# Patient Record
Sex: Female | Born: 1960 | Race: White | Hispanic: Yes | Marital: Married | Smoking: Never smoker
Health system: Southern US, Community
[De-identification: ages and names within clinical notes are randomized; demographics above are authoritative.]

## PROBLEM LIST (undated history)

## (undated) HISTORY — PX: KNEE SURGERY: SHX244

## (undated) HISTORY — PX: PATELLA FRACTURE SURGERY: SHX735

---

## 1997-10-07 ENCOUNTER — Other Ambulatory Visit: Admission: RE | Admit: 1997-10-07 | Discharge: 1997-10-07 | Payer: Self-pay | Admitting: Gynecology

## 2000-03-29 ENCOUNTER — Other Ambulatory Visit: Admission: RE | Admit: 2000-03-29 | Discharge: 2000-03-29 | Payer: Self-pay | Admitting: Gynecology

## 2002-04-18 ENCOUNTER — Other Ambulatory Visit: Admission: RE | Admit: 2002-04-18 | Discharge: 2002-04-18 | Payer: Self-pay | Admitting: Gynecology

## 2003-09-09 ENCOUNTER — Other Ambulatory Visit: Admission: RE | Admit: 2003-09-09 | Discharge: 2003-09-09 | Payer: Self-pay | Admitting: Gynecology

## 2004-09-09 ENCOUNTER — Other Ambulatory Visit: Admission: RE | Admit: 2004-09-09 | Discharge: 2004-09-09 | Payer: Self-pay | Admitting: Gynecology

## 2005-10-03 ENCOUNTER — Other Ambulatory Visit: Admission: RE | Admit: 2005-10-03 | Discharge: 2005-10-03 | Payer: Self-pay | Admitting: Gynecology

## 2006-09-24 ENCOUNTER — Emergency Department (HOSPITAL_COMMUNITY): Admission: EM | Admit: 2006-09-24 | Discharge: 2006-09-24 | Payer: Self-pay | Admitting: Emergency Medicine

## 2006-09-27 ENCOUNTER — Ambulatory Visit (HOSPITAL_COMMUNITY): Admission: RE | Admit: 2006-09-27 | Discharge: 2006-09-28 | Payer: Self-pay | Admitting: Orthopedic Surgery

## 2007-01-05 ENCOUNTER — Ambulatory Visit (HOSPITAL_COMMUNITY): Admission: RE | Admit: 2007-01-05 | Discharge: 2007-01-06 | Payer: Self-pay | Admitting: Orthopedic Surgery

## 2007-10-03 ENCOUNTER — Other Ambulatory Visit: Admission: RE | Admit: 2007-10-03 | Discharge: 2007-10-03 | Payer: Self-pay | Admitting: Gynecology

## 2008-12-01 ENCOUNTER — Ambulatory Visit: Payer: Self-pay | Admitting: Gynecology

## 2008-12-01 ENCOUNTER — Encounter: Payer: Self-pay | Admitting: Gynecology

## 2008-12-01 ENCOUNTER — Other Ambulatory Visit: Admission: RE | Admit: 2008-12-01 | Discharge: 2008-12-01 | Payer: Self-pay | Admitting: Gynecology

## 2010-07-27 NOTE — Op Note (Signed)
[name], [name]       ACCOUNT NO.:  [id number]   MEDICAL RECORD NO.:  [id number]          PATIENT TYPE:  OIB   LOCATION:  1538                         FACILITY:  [facility]   PHYSICIAN:  Ollen Gross, M.D.    DATE OF BIRTH:  1960-10-04   DATE OF PROCEDURE:  09/27/2006  DATE OF DISCHARGE:                               OPERATIVE REPORT   PREOPERATIVE DIAGNOSIS:  Left patella fracture.   POSTOPERATIVE DIAGNOSIS:  Left patella fracture.   PROCEDURE:  Open reduction internal fixation, left patella.   SURGEON:  Ollen Gross, M.D.   ASSISTANT:  Avel Peace PA-C   ANESTHESIA:  General.   ESTIMATED BLOOD LOSS:  Minimal.   DRAINS:  None.   TOURNIQUET TIME:  31 minutes at 300 mmHg.   COMPLICATIONS:  None.   CONDITION:  Stable to recovery.   BRIEF CLINICAL NOTE:  Marilyn Gross is a 50 year old female who sustained a  fracture of her left patella three days ago.  She presents now for  reduction internal fixation.   PROCEDURE IN DETAIL:  After successful administration of general  anesthetic, tourniquet was placed high on the left thigh, left lower  extremity prepped and draped usual sterile fashion.  Extremity was  wrapped in Esmarch.  Tourniquet inflated to 300 mmHg.  Midline incision  was made over the knee with the 10 blade through subcutaneous tissue to  the level of the extensor mechanism.  Superficial retinaculum is  incised.  There is transverse tear in the retinaculum as well as a  fracture at the junction of the distal one-third and proximal two thirds  of the patella.  There is a small amount of comminution.  I reduced it  with a towel clamp after thoroughly irrigating out the joint removing  hematoma.  A 0.062 K-wire was then driven from superior to inferior  starting at the superior pole of patella coursing across the fracture to  the inferior aspect.  The guidewires parallel to each other.  We then  passed a cerclage wire 16-gauge around the patella  circumferentially and  tightened this effectively closing down the fracture site and  anatomically reducing it.  A radiograph was taken AP and lateral.  The  more lateral pin was a little long, so I backed it up to the appropriate  length.  Then we cut the pins, bent them and rotated so they would not  protrude.  The x-rays taken AP and lateral showing excellent reduction  of the fracture.  Then thoroughly irrigated the wound and closed the  retinaculum with interrupted #1 Ethibond.  I flexed the knee against  gravity and 125 degrees was achieved without the fracture pulling apart.  Tourniquet was then released with a total time of 31 minutes.  Subcu  was closed with interrupted 2-0 Vicryl, subcuticular running 4-0  Monocryl.  The incisions cleaned and dried and Steri-Strips and bulky  sterile dressing applied.  She is placed into a hinged knee brace,  locked in full extension and then awakened and transported to recovery  in stable condition.      Ollen Gross, M.D.  Electronically Signed     FA/MEDQ  D:  09/27/2006  T:  09/28/2006  Job:  [id number]

## 2010-07-27 NOTE — Op Note (Signed)
Marilyn Gross, Marilyn Gross       ACCOUNT NO.:  [id number]   MEDICAL RECORD NO.:  [id number]          PATIENT TYPE:  OIB   LOCATION:  1609                         [facility]ACILITY:  [facility]   PHYSICIAN:  Ollen Gross, M.D.    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   OPERATION PERFORMED:  Left knee arthroscopic lysis of adhesions with  closed manipulation.   SURGEON:  Ollen Gross, M.D.   ASSISTANT:  None.   ANESTHESIA:  General.   ESTIMATED BLOOD LOSS:  Minimal.   DRAINS:  Hemovac times one.   COMPLICATIONS:  None occurred.   CLINICAL NOTE:  The patient a 50 year old female who had an on-the-job  injury with a left patellar fracture about 2.5 months ago.  She healed  uneventfully, but would not bend her knee with physical therapy.  She  had a range of about 10-40 degrees.  We had extensive therapy and  nonoperative management, but given her lack of progression I decided to  go ahead and do an arthroscopic lysis of adhesion with an associated  closed manipulation to get her motion back.   OPERATION IN DETAIL:  After the successful administration of a general  anesthetic a tourniquet was placed on the left thigh and the left lower  extremity prepped and draped in the usual sterile fashion.  Standard  superomedial and inferolateral incisions were made.  Inflow cannula was  passed superomedial and the camera was passed inferolateral.  Prior to  this I did an exam under anesthesia and her range was truly 10-40  degrees.  Once the cannulae were in the inflow was initiated and camera  passed inferolateral.  Arthroscopic visualization proceeded.  There was  tremendous scarring under the patella and in the suprapatellar pouch.  I  created a superolateral portal and placed a shaver through that.  I  shaved away a lot of the scar tissue noted here and in the quad tendon  to the femur.  Once it was shaved out then I used the ArthroCare device  for obtaining hemostasis and for the remainder of the rest of scar  tissue in the suprapatellar area.  I got down to the superior pole of  the patella and there was no binding between the cartilage of the  patella and cartilage of the trochlea.  There were no cartilage defects  either.   I then created an inferomedial portal and placed the ArthroCare through  there.  I subsequently debrided the scar tissue in the medial gutter as  well as in the infrapatellar area.  We then went back superolateral and  debrided the scar tissue in the lateral gutter.  I then decreased the  inflow pressure and obtained hemostasis.  We then removed the  arthroscopic equipment and I checked the range of motion, and she up to  about 70 at this point.  I then gently flexed the knee with audible  lysis of the remaining adhesions; and, I got her  flexed all the way to  about 135 degrees to the point where her calf was touching her posterior  thigh.  She then also was able to come out in full extension.  Her  patellar mobility was greatly improved.   I then placed the inflow cannula and camera back into the joint from the  superomedial and inferolateral portals respectively.  The ArthroCare was  then passed through the inferomedial portal to further obtain  hemostasis.  I then placed it also through the superolateral portal to  obtain hemostasis.  The inflow pressure was decreased to identify any  other further bleeding; and, this was not identified.   We then removed the arthroscopic equipment from the inferior portal and  superolateral portal, and closed those with interrupted 4-0 nylon.  Twenty milliliters of one-quarter  percent Marcaine with epi was injected  through the inflow cannula.  Then a Hemovac drain was threaded through  the cannula and the cannula was removed.  The incisions were closed, but  the drain was not sewn in.  The bulky sterile dressings were applied;  and, I again flexed her and she got down to 135 degrees relatively  easily.  Full extension was also achieved.   The patient was subsequently awakened and was transported to recovery in  stable condition.      Ollen Gross, M.D.  Electronically Signed     FA/MEDQ  D:  01/05/2007  T:  01/07/2007  Job:  [id number]

## 2010-12-22 LAB — CBC
HCT: 39.3
Hemoglobin: 13.6
MCHC: 34.5
MCV: 91
RDW: 11.8

## 2010-12-22 LAB — BASIC METABOLIC PANEL
Creatinine, Ser: 0.63
Glucose, Bld: 99
Potassium: 4.3

## 2010-12-27 LAB — HEMOGLOBIN AND HEMATOCRIT, BLOOD
HCT: 35.7 — ABNORMAL LOW
Hemoglobin: 12.7

## 2010-12-27 LAB — PROTIME-INR: INR: 1

## 2010-12-27 LAB — PREGNANCY, URINE: Preg Test, Ur: NEGATIVE

## 2010-12-27 LAB — APTT: aPTT: 32

## 2012-07-12 ENCOUNTER — Encounter: Payer: Self-pay | Admitting: Gynecology

## 2012-07-26 ENCOUNTER — Ambulatory Visit (INDEPENDENT_AMBULATORY_CARE_PROVIDER_SITE_OTHER): Payer: No Typology Code available for payment source | Admitting: Gynecology

## 2012-07-26 ENCOUNTER — Encounter: Payer: Self-pay | Admitting: Gynecology

## 2012-07-26 ENCOUNTER — Other Ambulatory Visit (HOSPITAL_COMMUNITY)
Admission: RE | Admit: 2012-07-26 | Discharge: 2012-07-26 | Disposition: A | Discharge status: Home / Self Care | Payer: No Typology Code available for payment source | Source: Ambulatory Visit | Attending: Gynecology | Admitting: Gynecology

## 2012-07-26 VITALS — BP 128/82 | Ht 61.75 in | Wt 152.0 lb

## 2012-07-26 DIAGNOSIS — N951 Menopausal and female climacteric states: Secondary | ICD-10-CM

## 2012-07-26 DIAGNOSIS — Z1151 Encounter for screening for human papillomavirus (HPV): Secondary | ICD-10-CM | POA: Insufficient documentation

## 2012-07-26 DIAGNOSIS — Z01419 Encounter for gynecological examination (general) (routine) without abnormal findings: Secondary | ICD-10-CM

## 2012-07-26 DIAGNOSIS — Z23 Encounter for immunization: Secondary | ICD-10-CM

## 2012-07-26 LAB — CBC WITH DIFFERENTIAL/PLATELET
Basophils Absolute: 0 10*3/uL (ref 0.0–0.1)
Basophils Relative: 0 % (ref 0–1)
Eosinophils Absolute: 0.2 10*3/uL (ref 0.0–0.7)
HCT: 37.9 % (ref 36.0–46.0)
Hemoglobin: 12.8 g/dL (ref 12.0–15.0)
MCH: 31.6 pg (ref 26.0–34.0)
MCHC: 33.8 g/dL (ref 30.0–36.0)
Monocytes Absolute: 0.5 10*3/uL (ref 0.1–1.0)
Monocytes Relative: 7 % (ref 3–12)
Neutro Abs: 4.5 10*3/uL (ref 1.7–7.7)
Neutrophils Relative %: 57 % (ref 43–77)
RDW: 13.3 % (ref 11.5–15.5)

## 2012-07-26 LAB — COMPREHENSIVE METABOLIC PANEL
ALT: 17 U/L (ref 0–35)
AST: 19 U/L (ref 0–37)
Albumin: 4.7 g/dL (ref 3.5–5.2)
CO2: 25 mEq/L (ref 19–32)
Calcium: 9.8 mg/dL (ref 8.4–10.5)
Chloride: 102 mEq/L (ref 96–112)
Potassium: 3.9 mEq/L (ref 3.5–5.3)
Total Protein: 7 g/dL (ref 6.0–8.3)

## 2012-07-26 LAB — FOLLICLE STIMULATING HORMONE: FSH: 54.8 m[IU]/mL

## 2012-07-26 LAB — TSH: TSH: 1.67 u[IU]/mL (ref 0.350–4.500)

## 2012-07-26 LAB — LIPID PANEL
LDL Cholesterol: 94 mg/dL (ref 0–99)
VLDL: 17 mg/dL (ref 0–40)

## 2012-07-26 NOTE — Progress Notes (Signed)
No menses due to iud 

## 2012-07-26 NOTE — Progress Notes (Signed)
Marilyn Gross 06-Feb-1961 [id number]   History:    52 y.o.  for annual gyn exam with no complaints today. Review of patient's records indicated she has not been seen the office since 2010. She had a Mirena IUD placed in July of 2009. Patient would know prior history of abnormal Pap smears. Patient had a normal mammogram this year and does her monthly breast exam. She is not receive the Tdap vaccine ordered a colonoscopy as of yet.  Past medical history,surgical history, family history and social history were all reviewed and documented in the EPIC chart.  Gynecologic History No LMP recorded. Patient is not currently having periods (Reason: IUD). Contraception: IUD Last Pap: 2010. Results were: normal Last mammogram: 2014. Results were: normal  Obstetric History OB History   Grav Para Term Preterm Abortions TAB SAB Ect Mult Living   2 2        2      # Outc Date GA Lbr Len/2nd Wgt Sex Del Anes PTL Lv   1 PAR            2 PAR                ROS: A ROS was performed and pertinent positives and negatives are included in the history.  GENERAL: No fevers or chills. HEENT: No change in vision, no earache, sore throat or sinus congestion. NECK: No pain or stiffness. CARDIOVASCULAR: No chest pain or pressure. No palpitations. PULMONARY: No shortness of breath, cough or wheeze. GASTROINTESTINAL: No abdominal pain, nausea, vomiting or diarrhea, melena or bright red blood per rectum. GENITOURINARY: No urinary frequency, urgency, hesitancy or dysuria. MUSCULOSKELETAL: No joint or muscle pain, no back pain, no recent trauma. DERMATOLOGIC: No rash, no itching, no lesions. ENDOCRINE: No polyuria, polydipsia, no heat or cold intolerance. No recent change in weight. HEMATOLOGICAL: No anemia or easy bruising or bleeding. NEUROLOGIC: No headache, seizures, numbness, tingling or weakness. PSYCHIATRIC: No depression, no loss of interest in normal activity or change in sleep pattern.      Exam: chaperone present  BP 128/82  Ht 5' 1.75" (1.568 m)  Wt 152 lb (68.947 kg)  BMI 28.04 kg/m2  Body mass index is 28.04 kg/(m^2).  General appearance : Well developed well nourished female. No acute distress HEENT: Neck supple, trachea midline, no carotid bruits, no thyroidmegaly Lungs: Clear to auscultation, no rhonchi or wheezes, or rib retractions  Heart: Regular rate and rhythm, no murmurs or gallops Breast:Examined in sitting and supine position were symmetrical in appearance, no palpable masses or tenderness,  no skin retraction, no nipple inversion, no nipple discharge, no skin discoloration, no axillary or supraclavicular lymphadenopathy Abdomen: no palpable masses or tenderness, no rebound or guarding Extremities: no edema or skin discoloration or tenderness  Pelvic:  Bartholin, Urethra, Skene Glands: Within normal limits             Vagina: No gross lesions or discharge  Cervix: No gross lesions or discharge, IUD string seen  Uterus  anteverted, normal size, shape and consistency, non-tender and mobile  Adnexa  Without masses or tenderness  Anus and perineum  normal   Rectovaginal  normal sphincter tone without palpated masses or tenderness             Hemoccult cards provided     Assessment/Plan:  52 y.o. female for annual exam who was having some mild vasomotor symptoms and is now 52 years of age. Her IUD is scheduled to be removed in July of  this year. We are going to check her [facility] level today along with the rest of her labs. If her [facility] is in the menopausal range she will return back in July we will remove her IUD then. If the [facility] is in the normal range she will return back in July and we will change her IUD to a new one. The following labs were also ordered CBC, fasting lipid profile, comprehensive metabolic panel, as well as Pap smear. Tdap vaccine was done today. Patient was given the name of one of my GI colleagues for her to schedule colonoscopy. She was  reminded to submit to the office Hemoccult cards for testing. We discussed importance of monthly self breast examination as well as calcium and vitamin D on a regular basis as well as exercise. All the above was explained to the patient in Spanish.    Reynaldo Minium H MD, 1:25 PM 07/26/2012

## 2012-07-26 NOTE — Patient Instructions (Addendum)
Control del colesterol  Los niveles de colesterol en el organismo estn determinados significativamente por su dieta. Los niveles de colesterol tambin se relacionan con la enfermedad cardaca. El material que sigue ayuda a explicar esta relacin y a analizar qu puede hacer para mantener su corazn sano. No todo el colesterol es malo. Las lipoprotenas de baja densidad (LDL) forman el colesterol "malo". El colesterol malo puede ocasionar depsitos de grasa que se acumulan en el interior de las arterias. Las lipoprotenas de alta densidad (HDL) es el colesterol "bueno". Ayuda a remover el colesterol LDL "malo" de la sangre. El colesterol es un factor de riesgo muy importante para la enfermedad cardaca. Otros factores de riesgo son la hipertensin arterial, el hbito de fumar, el estrs, la herencia y el peso.   El msculo cardaco obtiene el suministro de sangre a travs de las arterias coronarias. Si su colesterol LDL ("malo") est elevado y el HDL ("bueno") es bajo, tiene un factor de riesgo para que se formen depsitos de grasa en las arterias coronarias (los vasos sanguneos que suministran sangre al corazn). Esto hace que haya menos lugar para que la sangre circule. Sin la suficiente sangre y oxgeno, el msculo cardaco no puede funcionar correctamente, y usted podr sentir dolores en el pecho (angina pectoris). Cuando una arteria coronaria se cierra completamente, una parte del msculo cardaco puede morir (infarto de miocardio).  CONTROL DEL COLESTEROL Cuando el profesional que lo asiste enva la sangre al laboratorio para conocer el nivel de colesterol, puede realizarle tambin un perfil completo de los lpidos. Con esta prueba, se puede determinar la cantidad total de colesterol, as como los niveles de LDL y HDL. Los triglicridos son un tipo de grasa que circula en la sangre y que tambin puede utilizarse para determinar el riesgo de enfermedad  cardaca. En la siguiente tabla se establecen los nmeros ideales: Prueba: Colesterol total  Menos de 200 mg/dl.  Prueba: LDL "colesterol malo"  Menos de 100 mg/dl.   Menos de 70 mg/dl si tiene riesgo muy elevado de sufrir un ataque cardaco o muerte cardaca sbita.  Prueba: HDL "colesterol bueno"  Mujeres: Ms de 50 mg/dl.   Hombres: Ms de 40 mg/dl.  Prueba: Trigliceridos  Menos de 150 mg/dl.    CONTROL DEL COLESTEROL CON DIETA Aunque factores como el ejercicio y el estilo de vida son importantes, la "primera lnea de ataque" es la dieta. Esto se debe a que se sabe que ciertos alimentos hacen subir el colesterol y otros lo bajan. El objetivo debe ser equilibrar los alimentos, de modo que tengan un efecto sobre el colesterol y, an ms importante, reemplazar las grasas saturadas y trans con otros tipos de grasas, como las monoinsaturadas y las poliinsaturadas y cidos grasos omega-3 . En promedio, una persona no debe consumir ms de 15 a 17 g de grasas saturadas por da. Las grasas saturadas y trans se consideran grasas "malas", ya que elevan el colesterol LDL. Las grasas saturadas se encuentran principalmente en productos animales como carne, manteca y crema. Pero esto no significa que usted debe sacrificar todas sus comidas favoritas. Actualmente, como lo muestra el cuadro que figura al final de este documento, hay sustitutos de buen sabor, bajos en grasas y en colesterol, para la mayora de los alimentos que a usted le gusta comer. Elija aquellos alimentos alternativos que sean bajos en grasas o sin grasas. Elija cortes de carne del cuarto trasero o lomo ya que estos cortes son los que tienen menor cantidad de grasa   y colesterol. El pollo (sin piel), el pescado, la carne de ternera, y la pechuga de pavo molida son excelentes opciones. Elimine las carnes grasosas como los hotdogs o el salami. Los mariscos tienen poco o nada de grasas saturadas. Cuando consuma carne magra, carne de aves de  corral, o pescado, hgalo en porciones de 85 gramos (3 onzas). Las grasas trans tambin se llaman "aceites parcialmente hidrogenados". Son aceites manipulados cientficamente de modo que son slidos a temperatura ambiente, tienen una larga vida y mejoran el sabor y la textura de los alimentos a los que se agregan. Las grasas trans se encuentran en la margarina, masitas, crackers y alimentos horneados.  Para hornear y cocinar, el aceite es un excelente sustituto para la mantequilla. Los aceites monoinsaturados tienen un beneficio particular, ya que se cree que disminuyen el colesterol LDL (colesterol malo) y elevan el HDL. Deber evitar los aceites tropicales saturados como el de coco y el de palma.  Recuerde, adems, que puede comer sin restricciones los grupos de alimentos que son naturalmente libres de grasas saturadas y grasas trans, entre los que se incluyen el pescado, las frutas (excepto el aguacate), verduras, frijoles, cereales (cebada, arroz, cuzcuz, trigo) y las pastas (sin salsas con crema)   IDENTIFIQUE LOS ALIMENTOS QUE DISMINUYEN EL COLESTEROL  Pueden disminuir el colesterol las fibras solubles que estn en las frutas, como las manzanas, en los vegetales como el brcoli, las patatas y las zanahorias; en las legumbres como frijoles, guisantes y lentejas; y en los cereales como la cebada. Los alimentos fortificados con fitosteroles tambin pueden disminuir el colesterol. Debe consumir al menos 2 g de estos alimentos a diario para obtener el efecto de disminucin de colesterol.  En el supermercado, lea las etiquetas de los envases para identificar los alimentos bajos en grasas saturadas, libres de grasas trans y bajos en grasas, . Elija quesos que tengan solo de 2 a 3 g de grasa saturada por onza (28,35 g). Use una margarina que no dae el corazn, libre de grasas trans o aceite parcialmente hidrogenado. Al comprar alimentos horneados (galletitas dulces y galletas) evite el aceite parcialmente  hidrogenado. Los panes y bollos debern ser de granos enteros (harina de maz o de avena entera, en lugar de "harina" o "harina enriquecida"). Compre sopas en lata que no sean cremosas, con bajo contenido de sal y sin grasas adicionadas.   TCNICAS DE PREPARACIN DE LOS ALIMENTOS  Nunca fra los alimentos en aceite abundante. Si debe frer, hgalo en poco aceite y removiendo constantemente, porque as se utilizan muy pocas grasas, o utilice un spray antiadherente. Cuando le sea posible, hierva, hornee o ase las carnes y cocine los vegetales al vapor. En vez de aderezar los vegetales con mantequilla o margarina, utilice limn y hierbas, pur de manzanas y canela (para las calabazas y batatas), yogurt y salsa descremados y aderezos para ensaladas bajos en contenido graso.   BAJO EN GRASAS SATURADAS / SUSTITUTOS BAJOS EN GRASA  Carnes / Grasas saturadas (g)  Evite: Bife, corte graso (3 oz/85 g) / 11 g   Elija: Bife, corte magro (3 oz/85 g) / 4 g   Evite: Hamburguesa (3 oz/85 g) / 7 g   Elija:  Hamburguesa magra (3 oz/85 g) / 5 g   Evite: Jamn (3 oz/85 g) / 6 g   Elija:  Jamn magro (3 oz/85 g) / 2.4 g   Evite: Pollo, con piel (3 oz/85 g), Carne oscura / 4 g   Elija:  Pollo, sin piel (  3 oz/85 g), Carne oscura / 2 g   Evite: Pollo, con piel (3 oz/85 g), Carne magra / 2.5 g   Elija: Pollo, sin piel (3 oz/85 g), Carne magra / 1 g  Lcteos / Grasas saturadas (g)  Evite: Leche entera (1 taza) / 5 g   Elija: Leche con bajo contenido de grasa, 2% (1 taza) / 3 g   Elija: Leche con bajo contenido de grasa, 1% (1 taza) / 1.5 g   Elija: Leche descremada (1 taza) / 0.3 g   Evite: Queso duro (1 oz/28 g) / 6 g   Elija: Queso descremado (1 oz/28 g) / 2-3 g   Evite: Queso cottage, 4% grasa (1 taza)/ 6.5 g   Elija: Queso cottage con bajo contenido de grasa, 1% grasa (1 taza)/ 1.5 g   Evite: Helado (1 taza) / 9 g   Elija: Sorbete (1 taza) / 2.5 g   Elija: Yogurt helado sin contenido de  grasa (1 taza) / 0.3 g   Elija: Barras de fruta congeladas / vestigios   Evite: Crema batida (1 cucharada) / 3.5 g   Elija: Batidos glac sin lcteos (1 cucharada) / 1 g  Condimentos / Grasas saturadas (g)  Evite: Mayonesa (1 cucharada) / 2 g   Elija: Mayonesa con bajo contenido de grasa (1 cucharada) / 1 g   Evite: Manteca (1 cucharada) / 7 g   Elija: Margarina extra light (1 cucharada) / 1 g   Evite: Aceite de coco (1 cucharada) / 11.8 g   Elija: Aceite de oliva (1 cucharada) / 1.8 g   Elija: Aceite de maz (1 cucharada) / 1.7 g   Elija: Aceite de crtamo (1 cucharada) / 1.2 g   Elija: Aceite de girasol (1 cucharada) / 1.4 g   Elija: Aceite de soja (1 cucharada) / 2.4 g   Elija: Aceite de canola (1 cucharada) / 1 g  Document Released: 02/28/2005 Document Revised: 11/10/2010 [employer] Patient Information 2012 [employer]. Ejercicios para perder peso (Exercise to Lose Weight) La actividad fsica y una dieta saludable ayudan a perder peso. El mdico podr sugerirle ejercicios especficos. IDEAS Y CONSEJOS PARA HACER EJERCICIOS  Elija opciones econmicas que disfrute hacer , como caminar, andar en bicicleta o los vdeos para ejercitarse.   Utilice las escaleras en lugar del ascensor.   Camine durante la hora del almuerzo.   Estacione el auto lejos del lugar de trabajo o estudio.   Concurra a un gimnasio o tome clases de gimnasia.   Comience con 5  10 minutos de actividad fsica por da. Ejercite hasta 30 minutos, 4 a 6 das por semana.   Utilice zapatos que tengan un buen soporte y ropas cmodas.   Elongue antes y despus de ejercitar.   Ejercite hasta que aumente la respiracin y el corazn palpite rpido.   Beba agua extra cuando ejercite.   No haga ejercicio hasta lastimarse, sentirse mareado o que le falte mucho el aire.  La actividad fsica puede quemar alrededor de 150 caloras.  Correr 20 cuadras en 15 minutos.   Jugar vley durante 45 a 60  minutos.   Limpiar y encerar el auto durante 45 a 60 minutos.   Jugar ftbol americano de toque.   Caminar 25 cuadras en 35 minutos.   Empujar un cochecito 20 cuadras en 30 minutos.   Jugar baloncesto durante 30 minutos.   Rastrillar hojas secas durante 30 minutos.   Andar en bicicleta 80 cuadras en 30 minutos.     Caminar 30 cuadras en 30 minutos.   Bailar durante 30 minutos.   Quitar la nieve con una pala durante 15 minutos.   Nadar vigorosamente durante 20 minutos.   Subir escaleras durante 15 minutos.   Andar en bicicleta 60 cuadras durante 15 minutos.   Arreglar el jardn entre 30 y 45 minutos.   Saltar a la soga durante 15 minutos.   Limpiar vidrios o pisos durante 45 a 60 minutos.  Document Released: 06/04/2010 Document Revised: 11/10/2010 [facility] Patient Information 2012 [place], [place].Vacuna difteria/ttanos (Td) o [place] difteria, ttanos, tos convulsa (Tdap), Lo que debe saber (Tetanus, Diphtheria [Td] or Tetanus, Diphtheria, Pertussis [Tdap] Vaccine, What You Need to Know) PORQU VACUNARSE? El ttanos , la difteria y la tos ferina pueden ser enfermedades graves.  El TTANOS  (trismo) provoca la contraccin dolorosa y rigidez de los msculos, por lo general, en todo el cuerpo.   Puede causar la contraccin de los msculos de la cabeza y el cuello de modo que el enfermo no puede abrir la boca ni tragar., y en algunos casos, tampoco puede respirar.. El ttanos causa la muerte de 1 de cada 5 personas que se infectan. LA DIFTERIA produce la formacin de una membrana gruesa que cubre el fondo de la garganta.  Puede causar problemas respiratorios, parlisis, insuficiencia cardaca, e incluso la muerte. El PERTUSIS (tos [place]) causa ataques de tos intensa que pueden dificultar la respiracin, provocar vmitos e interrumpir el sueo.   Puede causar prdida de peso, incontinencia, fractura de [place], y desmayos por la intensa tos. Hasta de 2 de cada 100  adolescentes y 5 de cada 100 adultos que enferman de tos [place] deben ser hospitalizados o tienen complicaciones como la neumona y la [place]. Estas 3 enfermedades son provocadas por bacterias. La difteria y la tos Benetta Spar se [place] de persona a Social worker. El ttanos ingresa al organismo a travs de cortes, rasguos o heridas. En los [place] ocurran alrededor de 200 000 casos por ao de difteria y tos [place], antes de que existieran las [place], y tambin ocurran cientos de casos de ttanos. Desde la aparicin de las vacunas, el ttanos y la difteria han disminuido en alrededor del 99% y los casos de tos ferina disminuyeron aproximadamente el 92%.  Los nios menores de 6 aos deben recibir la vacuna DTaP para estar protegidos contra estas tres enfermedades. Pero los [employer], los adolescentes y los adultos tambin necesitan proteccin. VACUNAS PARA ADOLESCENTES Y ADULTOS Vacunas Tdap y Td  Hay dos vacunas disponibles para proteger de estas enfermedades a nios a Glass blower/designer de los 7aos:   La vacuna Td fue utilizada durante muchos aos. [employer] contra el ttanos y la difteria.  La vacuna Tdap fue autorizada en 2005. Es la primera vacuna para adolescentes y adultos que protege contra la tos ferina y el ttanos y la difteria. Una dosis de refuerzo de la Td se recomienda cada 10 aos. La Tdap se aplica slo una vez.  QU VACUNA DEBO APLICARME Y CUANDO? Las edades de 7 a 18 aos  [employer] 11 y los 12 aos se recomienda una dosis de Tdap. Esta dosis puede aplicarse desde los 7 aos en los nios que no han recibido una o ms dosis de DTaP anteriormente.  Los nios y adolescentes que no recibieron todas las dosis programadas de DTaP o DTP a los 7 aos deben completar la serie usando una combinacin de Td y Tdap. Adultos de 19 aos o ms  [employer] deben recibir  una dosis de refuerzo de Td cada 10 aos. Los adultos de menos de 65 aos que nunca hayan recibido la Tdap deben reemplazarla  por la siguiente dosis de refuerzo. Los adultos a partir de los 65 aos puedenrecibir una dosis de Tdap.  Los adultos (incluyendo las mujeres que podran quedar embarazadas y los adultos mayores de 65 aos) que tienen contacto cercano con un beb menor de 12 meses deben aplicarse una dosis de Tdap para proteger al beb de la tos [place].  Los trabajadores de la salud que tengan contacto directo con pacientes en hospitales o clnicas deben recibir una dosis de Tdap. Proteccin despus de [place] herida  Es posible que una persona que tenga un corte o quemadura grave necesite una dosis de Td o Tdap para prevenir la infeccin por ttanos. Puede usarse la Tdap en personas que nunca recibieron una dosis. Pero debe usarse la Td, si la Tdap no se encuentra disponible, o para:  Cualquier persona que haya recibido una dosis de Tdap.  Los nios [employer] 7 y los 9 aos que han [employer] series de DTap anteriormente.  Adultos de 65 aos o ms. Mujeres embarazadas.   Las mujeres embarazadas que nunca recibieron una dosis de Ddap deben recibirla despus de la 20a semana de gestacin y preferiblemente durante Contractor. trimestre. Si no se aplican la Tdap durante el embarazo, deben recibirla lo antes posible despus del parto. Las mujeres embarazadas que han recibido la Tdap y tienen que aplicarse la vacuna contra el ttanos o la difteria durante el [place], deben recibir la Td. Las vacunas Tdap y Td pueden ser administradas al mismo tiempo que otras vacunas. ALGUNAS PERSONAS NO DEBEN RECIBIR LA VACUNA O DEBEN [employer]  Las personas que hayan tenido una reaccin alrgica que haya puesto en peligro su vida despus de una dosis de vacuna contra el ttanos, la difteria o la tos ferina no deben recibir Td ni Tdap..  Las personas que tengan alergias graves a algn componente de una vacuna no deben recibir esa vacuna. Informe a su mdico si la persona que recibe la vacuna sufre alergias graves.  Cualquier persona  que [employer] en coma o que haya tenido convulsiones dentro de los 7 [address]  [address] posteriores despus de una dosis de DTP o DTaP no debe recibir la Tdap, salvo que se encuentre una causa que no fuera la vacuna. Estas personas pueden recibir Td.  Consulte a su mdico si la persona que recibe [place] de las vacunas:  Tiene epilepsia o algn otro problema del sistema nervioso.  Tuvo inflamacin o dolor intenso despus de una dosis de DTP, DTaP, DT, Td, o Tdap.  Ha tenido el sndrome de Scientific laboratory technician (GBS por sus siglas en ingls). Las personas que sufran una enfermedad moderada o grave el da en que se programa la vacuna, deben esperar a recuperarse para recibir las vacunas Tdap o Td. Por lo general, una persona con una enfermedad leve o fiebre baja puede recibir la vacuna. CULES SON LOS RIESGOS DE LAS VACUNAS TDAP Y TD? Con Cathleen Corti, al igual que con cualquier [employer], siempre existe un pequeo riesgo de una reaccin alrgica que ponga en peligro la vida o cause otro problema grave. Todo procedimiento mdico, inclusive la vacunacin pueden causar breves episodios de lipotimia o sntomas relacionados (como movimientos espasmdicos). Para evitar los [employer] y las lesiones causadas por las cadas, permanezca sentado o recustese durante los 15 minutos posteriores a la vacunacin. Informe a su mdico si el paciente  se siente dbil o mareado, tiene cambios en la visin o siente zumbidos en los odos.  Es mucho ms probable que tener ttanos, difteria, o tos ferina cause problemas ms graves que los provocados por recibir cualquiera de las vacunas Td o Tdap. A continuacin se enumeran los problemas informados despus de las vacunas Td y Tdap. Problemas Leves (perceptibles, pero que no interfirieron con las actividades): Tdap  Dolor (alrededor de 3 de cada 4 adolescentes y 2 de cada 3 adultos).  Enrojecimiento o inflamacin en el sitio de la inyeccin (alrededor de 1 de cada 5).  Fiebre leve de al  menos 100.4 F (38 C) (hasta alrededor de 1 cada 25 adolescentes y 1 de cada 100 adultos).  Dolor de cabeza (alrededor de 4 de cada 10 adolescentes y 3 de cada 10 adultos).  Cansancio (alrededor de 1 de cada 3 adolescentes y 1 de cada 4 adultos).  Nuseas, vmitos, diarrea, o dolor de estmago (hasta 1 de cada 4 adolescentes y 1 de cada 10 adultos).  Escalofros, dolores corporales, dolor articular, erupciones, o inflamacin de las glndulas (poco frecuente). Td  Dolor (hasta alrededor de 8 de cada 10).  Enrojecimiento o inflamacin de la inyeccin (alrededor de 1 de cada 3).  Fiebre leve (hasta alrededor de 1 de cada 5).  Dolor de cabeza o cansancio (poco frecuente). Problemas Moderados (interfieren con las [place], [place] no requieren atencin mdica): Tdap  Dolor en el sitio de la inyeccin (alrededor de 1 de cada 20 adolescentes y 1 de cada 100 adultos).  Enrojecimiento o inflamacin de la inyeccin (alrededor de 1 de cada 16 adolescentes y 1 de cada 25 adultos).  Fiebre de ms de 102 F (38.9 C) (alrededor de 1 de cada 100 adolescentes y 1 de cada 250 adultos).  Dolor de cabeza (1 de cada 300).  Nuseas, vmitos, diarrea, o dolor de estmago (hasta 3 de cada 100 adolescentes y 1 de cada 100 adultos). Td  Fiebre de ms de 102 F (38.9 C) (poco comn). Tdap o Td  Inflamacin de gran extensin en el brazo en el que se aplic la vacuna (hasta 3 de cada 100). Problemas [place] (no puede realizar [employer]; requiere Psychologist, prison and probation services) Tdap o Td  Inflamacin, dolor intenso, sangrado y enrojecimiento en el brazo, en el sitio de la inyeccin (poco frecuente). Puede producirse una reaccin alrgica grave despus de cualquier vacuna. Se estima que estas reacciones ocurren en menos de una de cada un milln de dosis. QU PASA SI HAY UNA REACCIN GRAVE? Qu signos debo buscar? Cualquier estado poco habitual, como una reaccin alrgica grave o fiebre alta. Si le  produce Runner, broadcasting/film/video grave, se manifestar dentro de algunos minutos a una hora despus de recibir la vacuna. Entre los signos de Automotive engineer grave se encuentran la dificultad para respirar, debilidad, ronquera o sibilancias, latidos cardacos acelerados, urticaria, mareos, palidez, o inflamacin de la garganta. Qu debo hacer?  Comunquese con su mdico o lleve inmediatamente a la persona al mdico.  Dgale a su mdico qu ocurri, la fecha y hora en que sucedi y cundo le aplicaron la vacuna.  Pida a su mdico que informe sobre la reaccin llenando un formulario del Sistema de Informacin de Reacciones Adversos a las Administrator, arts (VAERS, por sus siglas en ingls). O, puede presentar este informe a travs del sitio web de VAERS [email or web address] o puede llamar al [phone]. VAERS no brinda asistencia mdica. PROGRAMA NACIONAL DE COMPENSACIN DE DAOS POR VACUNAS El [place] de Compensacin  de Daos por Vacunas (VICP) fue creado en 1986.  Aquellas personas que consideren que han sufrido un dao como consecuencia de una vacuna y quieren saber ms acerca del programa y como presentar Roslynn Amble, [place] llamar al [phone] o visitar su sitio web en [email or web address]  CMO Roxan Diesel MS INFORMACIN?  El profesional podr darle el prospecto de la vacuna o sugerirle otras fuentes de informacin.  Comunquese con el servicio de salud de su localidad o [address].  Comunquese con los Centros para el control y la prevencin de Child psychotherapist for Disease Control and Prevention , CDC).  Llame al [phone] (1-800-CDC-INFO).  Visite los sitios web de [employer] en [email or web address].uy CDC Td and Tdap Interim VIS-Spanish (04/06/10) Document Released: 06/16/2008 Document Revised: 05/23/2011 [facility] Patient Information 2013 [place], [place].

## 2012-07-27 LAB — URINALYSIS W MICROSCOPIC + REFLEX CULTURE
Bacteria, UA: NONE SEEN
Bilirubin Urine: NEGATIVE
Crystals: NONE SEEN
Hgb urine dipstick: NEGATIVE
Ketones, ur: NEGATIVE mg/dL
Protein, ur: NEGATIVE mg/dL
Specific Gravity, Urine: 1.01 (ref 1.005–1.030)
Urobilinogen, UA: 0.2 mg/dL (ref 0.0–1.0)

## 2012-07-29 LAB — URINE CULTURE

## 2012-07-30 ENCOUNTER — Encounter: Payer: Self-pay | Admitting: Gynecology

## 2012-07-31 ENCOUNTER — Other Ambulatory Visit: Payer: Self-pay | Admitting: Anesthesiology

## 2012-07-31 ENCOUNTER — Telehealth: Payer: Self-pay | Admitting: Gynecology

## 2012-07-31 DIAGNOSIS — N390 Urinary tract infection, site not specified: Secondary | ICD-10-CM

## 2012-07-31 MED ORDER — NITROFURANTOIN MONOHYD MACRO 100 MG PO CAPS: 100.0000 mg | ORAL_CAPSULE | Freq: Two times a day (BID) | ORAL | Status: DC

## 2012-07-31 NOTE — Telephone Encounter (Signed)
07/31/12-I checked pt Mirena benefits with her [employer] and was told it is covered at 100%. Debarah Crape will call pt as she doesn't speak English & give her this information/[place]

## 2012-08-03 ENCOUNTER — Encounter: Payer: Self-pay | Admitting: Gynecology

## 2012-08-09 ENCOUNTER — Other Ambulatory Visit: Payer: Self-pay | Admitting: Anesthesiology

## 2012-08-09 DIAGNOSIS — Z1211 Encounter for screening for malignant neoplasm of colon: Secondary | ICD-10-CM

## 2013-12-26 ENCOUNTER — Ambulatory Visit (INDEPENDENT_AMBULATORY_CARE_PROVIDER_SITE_OTHER): Payer: No Typology Code available for payment source | Admitting: Gynecology

## 2013-12-26 ENCOUNTER — Encounter: Payer: Self-pay | Admitting: Gynecology

## 2013-12-26 VITALS — BP 150/87 | Ht 63.0 in | Wt 167.0 lb

## 2013-12-26 DIAGNOSIS — Z01419 Encounter for gynecological examination (general) (routine) without abnormal findings: Secondary | ICD-10-CM

## 2013-12-26 DIAGNOSIS — N951 Menopausal and female climacteric states: Secondary | ICD-10-CM | POA: Insufficient documentation

## 2013-12-26 DIAGNOSIS — Z23 Encounter for immunization: Secondary | ICD-10-CM

## 2013-12-26 DIAGNOSIS — R635 Abnormal weight gain: Secondary | ICD-10-CM

## 2013-12-26 DIAGNOSIS — Z1159 Encounter for screening for other viral diseases: Secondary | ICD-10-CM

## 2013-12-26 LAB — CBC WITH DIFFERENTIAL/PLATELET
Basophils Absolute: 0 10*3/uL (ref 0.0–0.1)
Basophils Relative: 0 % (ref 0–1)
EOS ABS: 0.2 10*3/uL (ref 0.0–0.7)
Eosinophils Relative: 2 % (ref 0–5)
HCT: 38.2 % (ref 36.0–46.0)
HEMOGLOBIN: 12.9 g/dL (ref 12.0–15.0)
LYMPHS ABS: 2.5 10*3/uL (ref 0.7–4.0)
LYMPHS PCT: 32 % (ref 12–46)
MCH: 30.9 pg (ref 26.0–34.0)
MCHC: 33.8 g/dL (ref 30.0–36.0)
MCV: 91.6 fL (ref 78.0–100.0)
MONOS PCT: 8 % (ref 3–12)
Monocytes Absolute: 0.6 10*3/uL (ref 0.1–1.0)
NEUTROS PCT: 58 % (ref 43–77)
Neutro Abs: 4.5 10*3/uL (ref 1.7–7.7)
Platelets: 300 10*3/uL (ref 150–400)
RBC: 4.17 MIL/uL (ref 3.87–5.11)
RDW: 13.4 % (ref 11.5–15.5)
WBC: 7.7 10*3/uL (ref 4.0–10.5)

## 2013-12-26 LAB — LIPID PANEL
CHOLESTEROL: 195 mg/dL (ref 0–200)
HDL: 68 mg/dL (ref >39)
LDL Cholesterol: 109 mg/dL — ABNORMAL HIGH (ref 0–99)
TRIGLYCERIDES: 88 mg/dL (ref <150)
Total CHOL/HDL Ratio: 2.9 Ratio
VLDL: 18 mg/dL (ref 0–40)

## 2013-12-26 LAB — COMPREHENSIVE METABOLIC PANEL
ALBUMIN: 4.4 g/dL (ref 3.5–5.2)
ALT: 14 U/L (ref 0–35)
AST: 19 U/L (ref 0–37)
Alkaline Phosphatase: 89 U/L (ref 39–117)
BILIRUBIN TOTAL: 1.2 mg/dL (ref 0.2–1.2)
BUN: 10 mg/dL (ref 6–23)
CALCIUM: 9.7 mg/dL (ref 8.4–10.5)
CHLORIDE: 104 meq/L (ref 96–112)
CO2: 26 meq/L (ref 19–32)
Creat: 0.59 mg/dL (ref 0.50–1.10)
GLUCOSE: 91 mg/dL (ref 70–99)
Potassium: 4.6 mEq/L (ref 3.5–5.3)
SODIUM: 139 meq/L (ref 135–145)
TOTAL PROTEIN: 7 g/dL (ref 6.0–8.3)

## 2013-12-26 LAB — TSH: TSH: 2.168 u[IU]/mL (ref 0.350–4.500)

## 2013-12-26 NOTE — Progress Notes (Signed)
Marilyn MaeMaria D Gross [birth year] [id number]   History:    53 y.o.  for annual gyn exam with no complaints today. Patient had a Mirena IUD placed in 2009 patient was wondered if needed to be removed now. She is having minimal if any vasomotor symptoms. Patient with no past history of abnormal Pap smears. She's having normal menstrual cycles. Patient has not had her screening colonoscopy. Patient requesting flu vaccine today. Patient did receive the Tdap vaccine last year.  Past medical history,surgical history, family history and social history were all reviewed and documented in the EPIC chart.  Gynecologic History No LMP recorded. Patient is not currently having periods (Reason: IUD). Contraception: IUD Last Pap: 2014. Results were: normal Last mammogram: 2000 410. Results were: normal  Obstetric History OB History  Gravida Para Term Preterm AB SAB TAB Ectopic Multiple Living  2 2        2     # Outcome Date GA Lbr Len/2nd Weight Sex Delivery Anes PTL Lv  2 PAR           1 PAR                ROS: A ROS was performed and pertinent positives and negatives are included in the history.  GENERAL: No fevers or chills. HEENT: No change in vision, no earache, sore throat or sinus congestion. NECK: No pain or stiffness. CARDIOVASCULAR: No chest pain or pressure. No palpitations. PULMONARY: No shortness of breath, cough or wheeze. GASTROINTESTINAL: No abdominal pain, nausea, vomiting or diarrhea, melena or bright red blood per rectum. GENITOURINARY: No urinary frequency, urgency, hesitancy or dysuria. MUSCULOSKELETAL: No joint or muscle pain, no back pain, no recent trauma. DERMATOLOGIC: No rash, no itching, no lesions. ENDOCRINE: No polyuria, polydipsia, no heat or cold intolerance. No recent change in weight. HEMATOLOGICAL: No anemia or easy bruising or bleeding. NEUROLOGIC: No headache, seizures, numbness, tingling or weakness. PSYCHIATRIC: No depression, no loss of interest in normal  activity or change in sleep pattern.     Exam: chaperone present  BP 150/87  Ht 5\' 3"  (1.6 m)  Wt 167 lb (75.751 kg)  BMI 29.59 kg/m2  Body mass index is 29.59 kg/(m^2).  General appearance : Well developed well nourished female. No acute distress HEENT: Neck supple, trachea midline, no carotid bruits, no thyroidmegaly Lungs: Clear to auscultation, no rhonchi or wheezes, or rib retractions  Heart: Regular rate and rhythm, no murmurs or gallops Breast:Examined in sitting and supine position were symmetrical in appearance, no palpable masses or tenderness,  no skin retraction, no nipple inversion, no nipple discharge, no skin discoloration, no axillary or supraclavicular lymphadenopathy Abdomen: no palpable masses or tenderness, no rebound or guarding Extremities: no edema or skin discoloration or tenderness  Pelvic:  Bartholin, Urethra, Skene Glands: Within normal limits             Vagina: No gross lesions or discharge  Cervix: No gross lesions or discharge, IUD string seen  Uterus  anteverted, normal size, shape and consistency, non-tender and mobile  Adnexa  Without masses or tenderness  Anus and perineum  normal   Rectovaginal  normal sphincter tone without palpated masses or tenderness             Hemoccult cards will be provided     Assessment/Plan:  53 y.o. female for annual exam who was counseled on the importance of screening colonoscopy name of gastroenterologist provided. The following labs were ordered: FSH, fasting lipid profile, comprehensive metabolic  panel, CBC, TSH, and urinalysis. Patient received the flu vaccine today. Pap smear not done today in accordance to the new guidelines.  New CDC guidelines is recommending patients be tested once in her lifetime for hepatitis C antibody who were born between [id number] through 1964. This was discussed with the patient today and has agreed to be tested today.  Assessment/plan: Perimenopausal patient on this year of Mirena IUD.  Information from recent medical literature indicating that patient may have an additional year of contraceptive coverage with a Mirena IUD. Since patient is perimenopausal I would recommend we'll leave the Mirena IUD until next year. [facility]. We discussed importance of calcium vitamin D and regular exercise for osteoporosis prevention. We discussed importance of monthly breast exams. Patient to schedule her mammogram which is overdue as well. She was reminded to symmetrical the office the fecal Hemoccult cards for testing at a later date. All the above information was provided in [place].   Ok EdwardsFERNANDEZ,Marilyn Mielke H MD, 11:43 AM 12/26/2013

## 2013-12-26 NOTE — Patient Instructions (Addendum)
Terapia de reemplazo hormonal (Hormone Replacement Therapy) En la menopausia, su cuerpo comienza a producir menos estrgeno y Immunologist. Esto provoca que el cuerpo deje de tener perodos Marilyn Gross. Esto se debe a que el estrgeno y la progesterona controlan sus perodos y su ciclo menstrual. Marilyn Gross falta de estrgeno puede causar sntomas tales como:  Social research officer, government.  Sequedad vaginal  Piel seca.  Prdida del deseo sexual.  Riesgo de prdida de hueso (osteoporosis). Cuando esto ocurre, puede elegir realizar Marilyn Gross terapia hormonal para volver a Clinical research associate estrgeno perdido [employer]. Cuando slo se introduce esta hormona, el procedimiento se conoce normalmente como TRE (terapia de reemplazo de [place]). Cuando la hormona progestina se combina con el estrgeno, el procedimiento se conoce normalmente como TH (terapia hormonal). Esto es lo que previamente se conoca como terapia de reemplazo hormonal (TRH). El profesional que le asiste le ayudar a tomar una decisin acerca de lo que resulte lo mejor para usted. La decisin de realizar una TRH cambia a menudo debido a que se Risk manager. Muchos estudios no ponen de acuerdo con respecto a los beneficios de Optometrist una terapia de reemplazo hormonal.  BENEFICIOS PROBABLES DE LA TRH QUE INCLUYEN PROTECCIN CONTRA:  Golpes de calor - Un golpe de calor es la sensacin repentina de calor sobre la cara y el cuerpo. La piel enrojece, como al sonrojarse. Estn asociados con la transpiracin y los trastornos del sueo. Las mujeres que atraviesan la menopausia pueden tener golpes de calor unas pocas veces en el mes o varios al da; esto depende de la mujer.  Osteoporosis (prdida de hueso) - El estrgeno ayuda a protegerse contra la prdida de [place]. Luego de la [place], los huesos de una mujer pierden calcio y se vuelven frgiles y Publishing rights manager. Como resultado, es ms probable que el hueso se [place]. Los que resultan afectados con  mayor frecuencia son los de la cadera, la [place] y la columna vertebral. La terapia hormonal puede ayudar a retardar la prdida de hueso luego de la menopausia. Realizar ejercicios con peso y tomar calcio con vitamina D tambin puede ayudar a prevenir la prdida de [place]. Existen medicamentos que puede prescribir el profesional que la asiste para ayudar a prevenir la osteoporosis.  Sequedad vaginal - La prdida de estrgeno produce cambios en la vagina. El recubrimiento de la misma puede volverse fino y Education officer, museum. Estos cambios pueden causar dolor y [place]. La sequedad tambin puede producir una infeccin. Puede ocasionarle ardor y [place].  Las infecciones en las vas urinarias son ms comunes luego de la menopausia debido a la falta de Oceanographer.  Otros beneficios posibles del estrgeno incluyen un cambio positivo en el humor y en la memoria de corto plazo en las mujeres. EFECTOS SECUNDARIOS Y RIESGOS  Utilizar estrgeno slo sin progesterona causa que el recubrimiento del tero crezca. Esto aumenta el riesgo de cncer endometrial. El profesional que la asiste deber darle otra hormona llamada progestina, si usted tiene tero.  Las mujeres que realizan una TH combinada (estrgeno y progestina) parecen tener un mayor riesgo de sufrir cncer de mama. El riesgo parece ser [place], [place] aumenta a lo largo del tiempo que se realice la [place].  La terapia combinada tambin hace que el tejido mamario sea levemente ms denso, lo que hace que sea ms difcil leer mamografas (radiografas de mama).  Combinada, la terapia de estrgeno y Immunologist puede realizarse todos los das, en cuyo caso podrn Warehouse manager de [place]. La TH puede realizarse  de manera cclica, en cuyo caso tendr perodos menstruales.  La TH puede aumentar el riesgo de [place], ataque cardaco, cncer de mama y formacin de cogulos en la pierna. TRATAMIENTO  Si decide realizar Marilyn Gross TH y tiene tero,  normalmente se prescribe el uso de estrgeno y progestina.  El profesional que la asiste le ayudar a decidir la mejor forma de [employer]el.  Lo mejor es Chief Executive Officer dosis posible que pueda ayudarla con sus sntomas y tomarlos durante la menor cantidad de tiempo posible.  La terapia hormonal puede ayudar a Banker de los problemas (sntomas) que afectan a las mujeres durante la menopausia. Antes de tomar una decisin con respecto a la TH, converse con el profesional que la asiste acerca de qu es lo mejor para usted. Mantngase bien informada y sintase cmoda con sus decisiones. INSTRUCCIONES PARA EL CUIDADO DOMICILIARIO:  [place] indicaciones del profesional con respecto a cmo Biomedical scientist.  Hgase controles de [place] regular, e incluya [place] y [place]. SOLICITE ATENCIN MDICA DE INMEDIATO SI PRESENTA:  Hemorragia vaginal anormal.  Dolor o inflamacin en las piernas, falta de aliento o Tourist information centre manager.  Mareos o dolores de [place].  Protuberancias o cambios en sus mamas o axilas.  Pronunciacin inarticulada.  Debilidad o adormecimiento en los brazos o las piernas.  Dolor, ardor o sangrado al [employer].  Dolor abdominal. Document Released: 08/17/2007 Document Revised: 05/23/2011 ExitCare Patient Information 2015 [place], [place]. This information is not intended to replace advice given to you by your health care provider. Make sure you discuss any questions you have with your health care provider. Perimenopausia (Perimenopause) La perimenopausia es el momento en que su cuerpo comienza a pasar a la menopausia (sin menstruacin durante 12 meses consecutivos). Es un proceso natural. La perimenopausia puede comenzar entre 2 y 90 aos antes de la menopausia y por lo general tiene una duracin de 1 ao ms pasada la menopausia. Yahoo[employer] Inc, los ovarios podran producir un vulo o no. Los ovarios varan su produccin de las hormonas  estrgeno y Technical brewer. Esto puede causar perodos menstruales irregulares, dificultad para quedar embarazada, hemorragia vaginal entre perodos y sntomas incmodos. CAUSAS  Produccin irregular de las hormonas ovricas estrgeno y Immunologist, y no ovular todos los meses.  Otras causas son:  Tumor de la glndula pituitaria.  Enfermedades que [employer] ovarios.  Radioterapia.  Quimioterapia.  Causas desconocidas.  Fumar mucho y abusar del consumo de alcohol puede llevar a que la perimenopausia aparezca antes. SIGNOS Y SNTOMAS   Acaloramiento.  Sudoracin nocturna.  Perodos menstruales irregulares.  Disminucin del deseo sexual.  Sequedad vaginal.  Dolores de cabeza.  Cambios en el estado de nimo.  Depresin.  Problemas de memoria.  Irritabilidad.  Cansancio.  Aumento de [place].  Problemas para quedar embarazada.  Prdida de clulas seas (osteoporosis).  Comienzo de endurecimiento de las arterias (aterosclerosis). DIAGNSTICO  El mdico realizar un diagnstico en funcin de su edad, historial de perodos menstruales y sntomas. Le realizarn un examen fsico para ver si hay algn cambio en su cuerpo, en especial en sus rganos reproductores. Las pruebas hormonales pueden ser o no tiles segn la cantidad de hormonas femeninas que produzca y [employer] K[employer] produzca. Sin embargo, podrn Microbiologist pruebas hormonales para Statistician. TRATAMIENTO  En algunos casos, no se necesita tratamiento. La decisin acerca de qu tratamiento es necesario durante la perimenopausia deber realizarse en conjunto con su mdico segn cmo estn afectando los sntomas  a su estilo de vida. Existen varios tratamientos disponibles, como:  Risk manager cada sntoma individual con medicamentos especficos para ese sntoma.  Algunos medicamentos herbales pueden ayudar en sntomas especficos.  Psicoterapia.  Terapia grupal. INSTRUCCIONES PARA EL CUIDADO EN  EL HOGAR   Controle sus periodos menstruales (cundo ocurren, qu tan abundantes son, cunto tiempo pasa entre perodos, y cunto duran) como tambin sus sntomas y cundo comenzaron.  Tome slo medicamentos de venta libre o recetados, segn las indicaciones del mdico.  Duerma y descanse.  Haga actividad fsica.  Consuma una dieta que contenga calcio (bueno para los [place]) y productos derivados de la soja (actan como estrgenos).  No fume.  Evite las bebidas alcohlicas.  Tome los suplementos vitamnicos segn las indicaciones del mdico. En ciertos casos, puede ser de [place] tomar vitamina E.  Tome suplementos de calcio y vitamina D para ayudar a Publishing rights manager prdida sea.  En algunos casos la terapia de grupo podr ayudarla.  La acupuntura puede ser de ayuda en ciertos casos. SOLICITE ATENCIN MDICA SI:   Tiene preguntas acerca de sus sntomas.  Necesita ser derivada a un especialista (gineclogo, psiquiatra, o psiclogo). SOLICITE ATENCIN MDICA DE INMEDIATO SI:   Sufre una hemorragia vaginal abundante.  Su perodo menstrual dura ms de 8 das.  Sus perodos son recurrentes cada menos de [address].  Tiene hemorragias durante las [employer].  Est muy deprimido.  Siente dolor al [employer].  Siente dolor de cabeza intenso.  Tiene problemas de visin. Document Released: 02/28/2005 Document Revised: 12/19/2012 [facility] Patient Information 2015 [place], [place]. This information is not intended to replace advice given to you by your health care provider. Make sure you discuss any questions you have with your health care provider. Influenza Virus Vaccine injection (Fluarix) Qu es este medicamento? La VACUNA ANTIGRIPAL ayuda a disminuir el riesgo de contraer la influenza, tambin conocida como la gripe. La vacuna solo ayuda a protegerle contra algunas cepas de influenza. Esta vacuna no ayuda a reducir Catering manager de contraer influenza pandmica H1N1. Este  medicamento puede ser utilizado para otros usos; si tiene alguna pregunta consulte con su proveedor de atencin mdica o con su farmacutico. MARCAS COMERCIALES DISPONIBLES: Fluarix, Fluzone Qu le debo informar a mi profesional de la salud antes de tomar este medicamento? Necesita saber si usted presenta alguno de los siguientes problemas o situaciones: -trastorno de sangrado como hemofilia -fiebre o infeccin -sndrome de Guillain-Barre u otros problemas neurolgicos -problemas del sistema inmunolgico -infeccin por el virus de la inmunodeficiencia humana (VIH) o SIDA -niveles bajos de plaquetas en la sangre -esclerosis mltiple -una Risk analyst o inusual a las vacunas antigripales, a los huevos, protenas de pollo, al ltex, a la gentamicina, a otros medicamentos, alimentos, colorantes o conservantes -si est embarazada o buscando quedar embarazada -si est amamantando a un beb Cmo debo utilizar este medicamento? Esta vacuna se administra mediante inyeccin por va intramuscular. Lo administra un profesional de KB Home	[place]. Recibir una copia de informacin escrita sobre la vacuna antes de cada vacuna. Asegrese de leer este folleto cada vez cuidadosamente. Este folleto puede cambiar con frecuencia. Hable con su pediatra para informarse acerca del uso de este medicamento en nios. Puede requerir atencin especial. Sobredosis: Pngase en contacto inmediatamente con un centro toxicolgico o una sala de urgencia si usted cree que haya tomado demasiado medicamento. ATENCIN: ConAgra Foods es solo para usted. No comparta este medicamento con nadie. Qu sucede si me olvido de una dosis? No se aplica en este caso. Gerrie Nordmann  interactuar con este medicamento? -quimioterapia o radioterapia -medicamentos que suprimen el sistema inmunolgico, tales como etanercept, anakinra, infliximab y adalimumab -medicamentos que tratan o previenen cogulos sanguneos, como  warfarina -fenitona -medicamentos esteroideos, como la prednisona o la cortisona -teofilina -vacunas Puede ser que esta lista no menciona todas las posibles interacciones. Informe a su profesional de Beazer Homesla salud de Ingram Micro Inctodos los productos a base de hierbas, medicamentos de [place] Beachventa libre o suplementos nutritivos que est tomando. Si usted fuma, consume bebidas alcohlicas o si utiliza drogas ilegales, indqueselo tambin a su profesional de Beazer Homesla salud. Algunas sustancias pueden interactuar con su medicamento. A qu debo estar atento al usar PPL Corporationeste medicamento? Informe a su mdico o a Producer, television/film/videosu profesional de la Dollar Generalsalud sobre todos los efectos secundarios que persistan despus de [address] das. Llame a su proveedor de atencin mdica si se presentan sntomas inusuales dentro de las 6 semanas posteriores a la vacunacin. Es posible que todava pueda contraer la gripe, pero la enfermedad no ser tan fuerte como normalmente. No puede contraer la gripe de esta vacuna. La vacuna antigripal no le protege contra resfros u otras enfermedades que pueden causar [place]. Debe vacunarse cada ao. Qu efectos secundarios puedo tener al Boston Scientificutilizar este medicamento? Efectos secundarios que debe informar a su mdico o a Producer, television/film/videosu profesional de la salud tan pronto como sea posible: -reacciones alrgicas como erupcin cutnea, picazn o urticarias, hinchazn de la cara, labios o lengua Efectos secundarios que, por lo general, no requieren atencin mdica (debe informarlos a su mdico o a su profesional de la salud si persisten o si son molestos): -fiebre -dolor de cabeza -molestias y dolores musculares -dolor, sensibilidad, enrojecimiento o Paramedichinchazn en el lugar de la inyeccin -cansancio o debilidad Puede ser que esta lista no menciona todos los posibles efectos secundarios. Comunquese a su mdico por asesoramiento mdico Hewlett-Packardsobre los efectos secundarios. Usted puede informar los efectos secundarios a la FDA por telfono al 1-800-FDA-1088. Dnde debo  guardar mi medicina? Esta vacuna se administra solamente en clnicas, farmacias, consultorio mdico u otro consultorio de un profesional de la salud y no Teacher, early years/prenecesitar guardarlo en su domicilio. ATENCIN: Este folleto es un resumen. Puede ser que no cubra toda la posible informacin. Si usted tiene preguntas acerca de esta medicina, consulte con su mdico, su farmacutico o su profesional de Radiographer, therapeuticla salud.  2015, Elsevier/Gold Standard. (2009-09-01 15:31:40)                                                   Control del colesterol  Los niveles de colesterol en el organismo estn determinados significativamente por su dieta. Los niveles de colesterol tambin se relacionan con la enfermedad cardaca. El material que sigue ayuda a Software engineerexplicar esta relacin y a Chiropractoranalizar qu puede hacer para mantener su corazn sano. No todo el colesterol es [place]. Las lipoprotenas de baja densidad (LDL) forman el colesterol "malo". El colesterol malo puede ocasionar depsitos de grasa que se acumulan en el interior de las arterias. Las lipoprotenas de alta densidad (HDL) es el colesterol "bueno". Ayuda a remover el colesterol LDL "malo" de la [place]. El colesterol es un factor de riesgo muy importante para la enfermedad cardaca. Otros factores de riesgo son la hipertensin arterial, el hbito de fumar, el estrs, la herencia y [place] peso.   El msculo cardaco obtiene el suministro de sangre a travs de las arterias  coronarias. Si su colesterol LDL ("malo") est elevado y el HDL ("bueno") es bajo, tiene un factor de riesgo para que se formen depsitos de Holiday representativegrasa en las arterias coronarias (los vasos sanguneos que suministran sangre al corazn). Esto hace que haya menos lugar para que la sangre circule. Sin la suficiente sangre y oxgeno, el msculo cardaco no puede funcionar correctamente, y usted podr sentir dolores en el pecho (angina pectoris). Cuando una arteria coronaria se cierra completamente, una parte del msculo cardaco puede  morir (infarto de miocardio).  CONTROL DEL COLESTEROL Cuando el profesional que lo asiste enva la sangre al laboratorio para Artistconocer el nivel de colesterol, puede realizarle tambin un perfil completo de los lpidos. Con esta prueba, se puede determinar la cantidad total de colesterol, as como los niveles de LDL y HDL. Los triglicridos son un tipo de grasa que circula en la sangre y que tambin puede utilizarse para determinar el riesgo de enfermedad cardaca. En la siguiente tabla se establecen los nmeros ideales: Prueba: Colesterol total  Menos de 200 mg/dl.  Prueba: LDL "colesterol malo"  Menos de 100 mg/dl.   Menos de 70 mg/dl si tiene riesgo muy elevado de sufrir un ataque cardaco o muerte cardaca sbita.  Prueba: HDL "colesterol bueno"  Mujeres: Ms de 50 mg/dl.   Hombres: Ms de 40 mg/dl.  Prueba: Trigliceridos  Menos de 150 mg/dl.    CONTROL DEL COLESTEROL CON DIETA Aunque factores como el ejercicio y el estilo de vida son importantes, la "primera lnea de ataque" es la dieta. Esto se debe a que se sabe que ciertos alimentos hacen subir el colesterol y otros lo Mexicobajan. El objetivo debe ser ConAgra Foodsequilibrar los alimentos, de modo que tengan un efecto sobre el colesterol y, an ms importante, Microbiologistreemplazar las grasas saturadas y trans con otros tipos de grasas, como las monoinsaturadas y las poliinsaturadas y cidos grasos omega-3 . En promedio, una persona no debe consumir ms de 15 a 17 g de grasas saturadas por C.H. Robinson Worldwideda. Las grasas saturadas y trans se consideran grasas "malas", ya que elevan el colesterol LDL. Las grasas saturadas se encuentran principalmente en productos animales como carne, Grand Ridgemanteca y crema. Pero esto no significa que usted Marketing executivedebe sacrificar todas sus comidas favoritas. Actualmente, como lo muestra el cuadro que figura al final de este documento, hay sustitutos de buen sabor, bajos en grasas y en colesterol, para la mayora de los alimentos que a usted Musicianle gusta comer. Elija  aquellos alimentos alternativos que sean bajos en grasas o sin grasas. Elija cortes de carne del cuarto trasero o lomo ya que estos cortes son los que tienen menor cantidad de grasa y Oncologistcolesterol. El pollo (sin piel), el pescado, la carne de ternera, y la DeBarypechuga de Solana Beachpavo molida son excelentes opciones. Elimine las carnes Tyson Foodsgrasosas como los hotdogs o el salami. Los Federal-Mogulmariscos tienen poco o nada de grasas saturadas. Cuando consuma carne Mount Judeamagra, carne de aves de corral, o pescado, hgalo en porciones de 85 gramos (3 onzas). Las grasas trans tambin se llaman "aceites parcialmente hidrogenados". Son aceites manipulados cientficamente de McGregormodo que son slidos a Publishing rights managertemperatura ambiente, tienen una larga vida y Glass blower/designermejoran el sabor y la textura de los alimentos a los que se Scientist, clinical (histocompatibility and immunogenetics)agregan. Las grasas trans se encuentran en la Soledadmargarina, Sunset Lakemasitas, crackers y alimentos horneados.  Para hornear y cocinar, el aceite es un excelente sustituto para la [place]. Los Engelhard Corporationaceites monoinsaturados tienen un beneficio particular, ya que se cree que disminuyen el colesterol LDL (colesterol Bull Hollowmalo) y Chagrin Fallselevan el  HDL. Deber evitar los aceites tropicales saturados como el de coco y el de [place].  Recuerde, adems, que puede comer sin restricciones los grupos de alimentos que son naturalmente libres de grasas saturadas y Neurosurgeon trans, entre los que se incluyen el pescado, las frutas (excepto el [place]), verduras, frijoles, cereales (cebada, arroz, [place], trigo) y las pastas (sin salsas con crema)   IDENTIFIQUE LOS ALIMENTOS QUE DISMINUYEN EL COLESTEROL  Pueden disminuir el colesterol las fibras solubles que estn en las frutas, como las [place], en los vegetales como el brcoli, las patatas y las zanahorias; en las legumbres como frijoles, guisantes y Therapist, occupational; y en los cereales como la cebada. Los alimentos fortificados con fitosteroles tambin Engineer, production. Debe consumir al menos 2 g de estos alimentos a diario para Financial planner  de disminucin de [place].  En el supermercado, lea las etiquetas de los envases para identificar los alimentos bajos en grasas saturadas, libres de grasas trans y bajos en [place], . Elija quesos que tengan solo de 2 a 3 g de grasa saturada por onza (28,35 g). Use una margarina que no dae el corazn, [place] de grasas trans o aceite parcialmente hidrogenado. Al comprar alimentos horneados (galletitas dulces y Gaffer) evite el aceite parcialmente hidrogenado. Los panes y bollos debern ser de granos enteros (harina de maz o de avena entera, en lugar de "harina" o "harina enriquecida"). Compre sopas en lata que no sean cremosas, con bajo contenido de sal y sin grasas adicionadas.   TCNICAS DE PREPARACIN DE LOS ALIMENTOS  Nunca fra los alimentos en aceite abundante. Si debe frer, hgalo en poco aceite y removiendo [place], porque as se utilizan muy pocas grasas, o utilice un spray antiadherente. Cuando le sea posible, hierva, hornee o ase las carnes y cocine los vegetales al vapor. En vez de Aetna con mantequilla o [place], utilice limn y hierbas, pur de Psychologist, educational y canela (para las calabazas y batatas), yogurt y salsa descremados y aderezos para ensaladas bajos en contenido graso.   BAJO EN GRASAS SATURADAS / SUSTITUTOS BAJOS EN GRASA  Carnes / Grasas saturadas (g)  Evite: Bife, corte graso (3 oz/85 g) / 11 g   Elija: Bife, corte magro (3 oz/85 g) / 4 g   Evite: Hamburguesa (3 oz/85 g) / 7 g   Elija:  Hamburguesa magra (3 oz/85 g) / 5 g   Evite: Jamn (3 oz/85 g) / 6 g   Elija:  Jamn magro (3 oz/85 g) / 2.4 g   Evite: Pollo, con piel (3 oz/85 g), Carne oscura / 4 g   Elija:  Pollo, sin piel (3 oz/85 g), Carne oscura / 2 g   Evite: Pollo, con piel (3 oz/85 g), Carne magra / 2.5 g   Elija: Pollo, sin piel (3 oz/85 g), Carne magra / 1 g  Lcteos / Grasas saturadas (g)  Evite: Leche entera (1 taza) / 5 g   Elija: Leche con bajo contenido de grasa, 2% (1  taza) / 3 g   Elija: Leche con bajo contenido de grasa, 1% (1 taza) / 1.5 g   Elija: Leche descremada (1 taza) / 0.3 g   Evite: Queso duro (1 oz/28 g) / 6 g   Elija: Queso descremado (1 oz/28 g) / 2-3 g   Evite: Queso cottage, 4% grasa (1 taza)/ 6.5 g   Elija: Queso cottage con bajo contenido de grasa, 1% grasa (1 taza)/ 1.5 g   Evite: Helado (1 taza) /  9 g   Elija: Sorbete (1 taza) / 2.5 g   Elija: Yogurt helado sin contenido de grasa (1 taza) / 0.3 g   Elija: Barras de fruta congeladas / vestigios   Evite: Crema batida (1 cucharada) / 3.5 g   Elija: Batidos glac sin lcteos (1 cucharada) / 1 g  Condimentos / Grasas saturadas (g)  Evite: Mayonesa (1 cucharada) / 2 g   Elija: Mayonesa con bajo contenido de grasa (1 cucharada) / 1 g   Evite: Manteca (1 cucharada) / 7 g   Elija: Margarina extra light (1 cucharada) / 1 g   Evite: Aceite de coco (1 cucharada) / 11.8 g   Elija: Aceite de oliva (1 cucharada) / 1.8 g   Elija: Aceite de maz (1 cucharada) / 1.7 g   Elija: Aceite de crtamo (1 cucharada) / 1.2 g   Elija: Aceite de girasol (1 cucharada) / 1.4 g   Elija: Aceite de soja (1 cucharada) / 2.4 g   Elija: Aceite de canola (1 cucharada) / 1 g  Document Released: 02/28/2005 Document Revised: 11/10/2010 [facility] Patient Information 2012 [place], [place]. Ejercicios para perder peso (Exercise to Lose Weight) La actividad fsica y Neomia Dear dieta saludable ayudan a perder peso. El mdico podr sugerirle ejercicios especficos. IDEAS Y CONSEJOS PARA HACER EJERCICIOS  Elija opciones econmicas que disfrute hacer , como caminar, andar en bicicleta o los vdeos para ejercitarse.   Utilice las Microbiologist del ascensor.   Camine durante la hora del almuerzo.   Estacione el auto lejos del lugar de [place] o [place].   Concurra a un gimnasio o tome clases de gimnasia.   Comience con 5  10 minutos de actividad fsica por da. Ejercite hasta 30 minutos, 4 a 6 das  por [address]   Utilice zapatos que tengan un buen soporte y ropas cmodas.   Elongue antes y despus de Company secretary.   Ejercite hasta que aumente la respiracin y el corazn palpite rpido.   Beba agua extra cuando ejercite.   No haga ejercicio Firefighter, sentirse mareado o que le falte mucho el aire.  La actividad fsica puede quemar alrededor de 150 caloras.  Correr 20 cuadras en 15 minutos.   Jugar vley durante 45 a 60 minutos.   Limpiar y encerar el auto durante 45 a 60 minutos.   Jugar ftbol americano de toque.   Caminar 25 cuadras en 35 minutos.   Empujar un cochecito 20 cuadras en 30 minutos.   Jugar baloncesto durante 30 minutos.   Rastrillar hojas secas durante 30 minutos.   Andar en bicicleta 80 cuadras en 30 minutos.   Caminar 30 cuadras en 30 minutos.   Bailar durante 30 minutos.   Quitar la nieve con una pala durante 15 minutos.   Nadar vigorosamente durante 20 minutos.   Subir escaleras durante 15 minutos.   Andar en bicicleta 60 cuadras durante 15 minutos.   Arreglar el jardn entre 30 y 45 minutos.   Saltar a la soga durante 15 minutos.   Limpiar vidrios o pisos durante 45 a 60 minutos.  Document Released: 06/04/2010 Document Revised: 11/10/2010 [facility] Patient Information 2012 [place], [place].

## 2013-12-27 LAB — URINALYSIS W MICROSCOPIC + REFLEX CULTURE
BILIRUBIN URINE: NEGATIVE
CASTS: NONE SEEN
CRYSTALS: NONE SEEN
GLUCOSE, UA: NEGATIVE mg/dL
Hgb urine dipstick: NEGATIVE
KETONES UR: NEGATIVE mg/dL
Nitrite: NEGATIVE
PH: 6 (ref 5.0–8.0)
Protein, ur: NEGATIVE mg/dL
SPECIFIC GRAVITY, URINE: 1.013 (ref 1.005–1.030)
UROBILINOGEN UA: 1 mg/dL (ref 0.0–1.0)

## 2013-12-27 LAB — HEPATITIS C ANTIBODY: HCV AB: NEGATIVE

## 2013-12-27 LAB — FOLLICLE STIMULATING HORMONE: FSH: 60.9 m[IU]/mL

## 2013-12-29 LAB — URINE CULTURE: Colony Count: 30000

## 2013-12-30 ENCOUNTER — Other Ambulatory Visit: Payer: Self-pay | Admitting: Gynecology

## 2013-12-30 MED ORDER — NITROFURANTOIN MONOHYD MACRO 100 MG PO CAPS: 100.0000 mg | ORAL_CAPSULE | Freq: Two times a day (BID) | ORAL | Status: DC

## 2014-01-09 ENCOUNTER — Other Ambulatory Visit: Payer: Self-pay | Admitting: Gynecology

## 2014-01-13 ENCOUNTER — Encounter: Payer: Self-pay | Admitting: Gynecology

## 2014-01-17 ENCOUNTER — Other Ambulatory Visit: Payer: No Typology Code available for payment source | Admitting: Anesthesiology

## 2014-01-17 DIAGNOSIS — Z1211 Encounter for screening for malignant neoplasm of colon: Secondary | ICD-10-CM

## 2015-11-05 ENCOUNTER — Ambulatory Visit (INDEPENDENT_AMBULATORY_CARE_PROVIDER_SITE_OTHER): Payer: 59 | Admitting: Gynecology

## 2015-11-05 ENCOUNTER — Encounter: Payer: Self-pay | Admitting: Gynecology

## 2015-11-05 VITALS — BP 128/76 | Ht 62.0 in | Wt 162.0 lb

## 2015-11-05 DIAGNOSIS — Z01419 Encounter for gynecological examination (general) (routine) without abnormal findings: Secondary | ICD-10-CM

## 2015-11-05 DIAGNOSIS — Z30432 Encounter for removal of intrauterine contraceptive device: Secondary | ICD-10-CM

## 2015-11-05 DIAGNOSIS — Z780 Asymptomatic menopausal state: Secondary | ICD-10-CM

## 2015-11-05 LAB — COMPREHENSIVE METABOLIC PANEL
ALBUMIN: 4.3 g/dL (ref 3.6–5.1)
ALK PHOS: 81 U/L (ref 33–130)
ALT: 14 U/L (ref 6–29)
AST: 18 U/L (ref 10–35)
BILIRUBIN TOTAL: 1 mg/dL (ref 0.2–1.2)
BUN: 11 mg/dL (ref 7–25)
CALCIUM: 9.5 mg/dL (ref 8.6–10.4)
CO2: 26 mmol/L (ref 20–31)
Chloride: 103 mmol/L (ref 98–110)
Creat: 0.56 mg/dL (ref 0.50–1.05)
Glucose, Bld: 93 mg/dL (ref 65–99)
POTASSIUM: 4.2 mmol/L (ref 3.5–5.3)
Sodium: 141 mmol/L (ref 135–146)
TOTAL PROTEIN: 6.9 g/dL (ref 6.1–8.1)

## 2015-11-05 LAB — CBC WITH DIFFERENTIAL/PLATELET
BASOS PCT: 1 %
Basophils Absolute: 75 cells/uL (ref 0–200)
Eosinophils Absolute: 150 cells/uL (ref 15–500)
Eosinophils Relative: 2 %
HEMATOCRIT: 40 % (ref 35.0–45.0)
HEMOGLOBIN: 13.2 g/dL (ref 11.7–15.5)
LYMPHS ABS: 2625 {cells}/uL (ref 850–3900)
Lymphocytes Relative: 35 %
MCH: 31.3 pg (ref 27.0–33.0)
MCHC: 33 g/dL (ref 32.0–36.0)
MCV: 94.8 fL (ref 80.0–100.0)
MONO ABS: 600 {cells}/uL (ref 200–950)
MPV: 9.7 fL (ref 7.5–12.5)
Monocytes Relative: 8 %
Neutro Abs: 4050 cells/uL (ref 1500–7800)
Neutrophils Relative %: 54 %
Platelets: 286 10*3/uL (ref 140–400)
RBC: 4.22 MIL/uL (ref 3.80–5.10)
RDW: 13 % (ref 11.0–15.0)
WBC: 7.5 10*3/uL (ref 3.8–10.8)

## 2015-11-05 LAB — LIPID PANEL
CHOL/HDL RATIO: 2.7 ratio (ref <=5.0)
CHOLESTEROL: 195 mg/dL (ref 125–200)
HDL: 73 mg/dL (ref >=46)
LDL Cholesterol: 106 mg/dL (ref <130)
TRIGLYCERIDES: 79 mg/dL (ref <150)
VLDL: 16 mg/dL (ref <30)

## 2015-11-05 LAB — TSH: TSH: 2.19 mIU/L

## 2015-11-05 NOTE — Progress Notes (Signed)
Marilyn MaeMaria D Gross 06-28-1960 [id number]   History:    55 y.o.  for annual gyn exam with no complaints today is here to have her Mirena IUD removed since she is now menopausal. Her IUD had been placed back in 2009. She reports normal menstrual cycles and no vasomotor symptoms. Patient has not had a mammogram since 2014 and has not had a colonoscopy as of yet.  Past medical history,surgical history, family history and social history were all reviewed and documented in the EPIC chart.  Gynecologic History No LMP recorded. Patient is not currently having periods (Reason: IUD). Contraception: IUD Last Pap: 2014. Results were: normal Last mammogram: 2014. Results were: normal  Obstetric History OB History  Gravida Para Term Preterm AB Living  2 2       2   SAB TAB Ectopic Multiple Live Births               # Outcome Date GA Lbr Len/2nd Weight Sex Delivery Anes PTL Lv  2 Para           1 Para                ROS: A ROS was performed and pertinent positives and negatives are included in the history.  GENERAL: No fevers or chills. HEENT: No change in vision, no earache, sore throat or sinus congestion. NECK: No pain or stiffness. CARDIOVASCULAR: No chest pain or pressure. No palpitations. PULMONARY: No shortness of breath, cough or wheeze. GASTROINTESTINAL: No abdominal pain, nausea, vomiting or diarrhea, melena or bright red blood per rectum. GENITOURINARY: No urinary frequency, urgency, hesitancy or dysuria. MUSCULOSKELETAL: No joint or muscle pain, no back pain, no recent trauma. DERMATOLOGIC: No rash, no itching, no lesions. ENDOCRINE: No polyuria, polydipsia, no heat or cold intolerance. No recent change in weight. HEMATOLOGICAL: No anemia or easy bruising or bleeding. NEUROLOGIC: No headache, seizures, numbness, tingling or weakness. PSYCHIATRIC: No depression, no loss of interest in normal activity or change in sleep pattern.     Exam: chaperone present  BP 128/76   Ht 5\' 2"   (1.575 m)   Wt 162 lb (73.5 kg)   BMI 29.63 kg/m   Body mass index is 29.63 kg/m.  General appearance : Well developed well nourished female. No acute distress HEENT: Eyes: no retinal hemorrhage or exudates,  Neck supple, trachea midline, no carotid bruits, no thyroidmegaly Lungs: Clear to auscultation, no rhonchi or wheezes, or rib retractions  Heart: Regular rate and rhythm, no murmurs or gallops Breast:Examined in sitting and supine position were symmetrical in appearance, no palpable masses or tenderness,  no skin retraction, no nipple inversion, no nipple discharge, no skin discoloration, no axillary or supraclavicular lymphadenopathy Abdomen: no palpable masses or tenderness, no rebound or guarding Extremities: no edema or skin discoloration or tenderness  Pelvic:  Bartholin, Urethra, Skene Glands: Within normal limits             Vagina: No gross lesions or discharge  Cervix: No gross lesions or discharge, IUD string visualized  Uterus  anteverted, normal size, shape and consistency, non-tender and mobile  Adnexa  Without masses or tenderness  Anus and perineum  normal   Rectovaginal  normal sphincter tone without palpated masses or tenderness             Hemoccult colonoscopy this year   The cervix was visualized and the IUD string was seen. With the use of a [place] clamp the IUD string was grasped the IUD  was retrieved shown to the patient and discarded  Assessment/Plan:  55 y.o. female for annual exam who is menopausal with 2 previous FSH in 2014 and 2015 respectively with the following result: 54.8 and 60.9. We discussed importance of calcium vitamin D and weightbearing exercises for osteoporosis prevention. Next year she will need a baseline bone density study. I've given the name of a gastroenterologist in our community for her to schedule her screening colonoscopy. I've also given a requisition to schedule her overdue mammogram. Her Pap smear was done today. The following  screening fasting blood work was drawn today: Comprehensive metabolic panel, fasting lipid profile, TSH, CBC, and urinalysis.   Ok EdwardsFERNANDEZ,Riyansh Gerstner H MD, 9:14 AM 11/05/2015

## 2015-11-05 NOTE — Patient Instructions (Addendum)
Colonoscopa (Colonoscopy) Neomia DearUna colonoscopa es un examen que se realiza para examinar todo el intestino grueso (colon). Este examen puede ayudar a Engineer, manufacturingdetectar problemas, como tumores, plipos, inflamacin y reas de Restaurant manager, fast foodhemorragia. El examen dura aproximadamente 1hora.  INFORME A SU MDICO:   Cualquier alergia que tenga.  Todos los Walt Disneymedicamentos que utiliza, incluidos vitaminas, hierbas, gotas oftlmicas, cremas y [address]medicamentos de [address] libre.  Problemas previos que usted o los Graybar Electricmiembros de su familia hayan tenido con el uso de anestsicos.  Enfermedades de Clear Channel Communicationsla sangre.  Cirugas previas.  Enfermedades patolgicas. RIESGOS Y COMPLICACIONES  En general, se trata de un procedimiento seguro. Sin embargo, Tree surgeoncomo en cualquier procedimiento, pueden surgir complicaciones. Las complicaciones posibles son:  Hemorragias.  Desgarro o ruptura de la pared del colon.  Reaccin a los Systems developermedicamentos administrados durante el examen.  Infeccin (raro). ANTES DEL PROCEDIMIENTO   Consulte a su mdico si debe cambiar o suspender los medicamentos que toma habitualmente.  Posiblemente se le recete una preparacin del colon por va oral. Esto incluye beber una gran cantidad de lquido medicinal desde el da anterior a su procedimiento. El lquido har que elimine muchas heces blandas hasta que sean casi claras o de color verdoso claro. De esta manera limpiar el colon para prepararlo para el procedimiento.  No coma ni beba nada ms una vez que haya comenzado con la preparacin del colon, a menos que el mdico le indique que es seguro Golden's Bridgehacerlo.  Pdale a alguna persona que la lleve a su casa luego del procedimiento. PROCEDIMIENTO   Le administrarn un medicamento para que pueda relajarse (sedante).  Se recostar de costado con las rodillas flexionadas.  Se insertar un tubo largo y flexible con Neomia Dearuna luz y Neomia Dearuna cmara en el extremo (colonoscopio) a travs del recto y dentro del colon. La cmara enviar el video hacia  una pantalla de computadora a medida que se vaya moviendo por el colon. El colonoscopio tambin libera dixido de carbono para inflar el colon. Esto ayuda a que el mdico pueda ver mejor el rea.  Durante el examen, es posible que su mdico tome una pequea muestra de tejido (biopsia) para examinarla bajo el microscopio, si se encuentran anormalidades.  El examen finaliza cuando se ha examinado todo el colon. DESPUS DEL PROCEDIMIENTO   No conduzca vehculos durante las 24horas posteriores al examen.  Es posible que encuentre una pequea cantidad de sangre en la materia fecal.  Gretchen ShortQuizs tenga cantidades moderadas de gases y calambres o hinchazn abdominales leves. Esto se produce a causa del gas utilizado para inflar el colon durante el examen.  Pregunte cundo estarn Hexion Specialty Chemicalslistos los resultados del examen y cmo los obtendr. Asegrese de [place] Midd[place]ownobtener los resultados.   Esta informacin no tiene Theme park managercomo fin reemplazar el consejo del mdico. Asegrese de hacerle al mdico cualquier pregunta que tenga.   Document Released: 12/08/2004 Document Revised: 12/19/2012 Elsevier Interactive Patient Education 2016 [employer] La menopausia (Menopause) La menopausia es el perodo normal de la vida en el cual los perodos menstruales cesan por completo. Se completa cuando no se tienen 12 perodos menstruales consecutivos. Ocurre generalmente en mujeres entre los 40 y los 55 aos. Muy rara vez una mujer tiene la menopausia antes de los 40 [place]aos. En la menopausia, los ovarios dejan de producir las hormonas femeninas estrgeno y Education officer, museumprogesterona. Esto puede causar sntomas indeseables y Sport and exercise psychologisttambin afectar a Radiographer, therapeuticla salud. A veces los sntomas aparecen entre 4 y 5 aos antes de que comience la menopausia. No existe una relacin entre la menopausia y:  Los [employer] Data Systems.  La cantidad de hijos que tuvo.  La raza.  La edad en que comenzaron los perodos menstruales (menarca). Las mujeres que fuman mucho y las que son muy  delgadas pueden desarrollar la menopausia a una edad ms temprana. CAUSAS  Los ovarios dejan de producir las hormonas femeninas estrgeno y Education officer, museum.  Otras causas son:  [place] en la que se extirpan ambos ovarios.  Los ovarios que dejan de funcionar sin un motivo conocido.  Tumores de la glndula pituitaria en el cerebro.  Enfermedades que [employer] ovarios y la produccin de hormonas.  Radioterapia en el abdomen o la pelvis.  Quimioterapia que afecte a los ovarios. SNTOMAS   Acaloramiento.  Sudoracin nocturna.  Disminucin del deseo sexual.  Sequedad vaginal y adelgazamiento de la vagina, lo que causa relaciones sexuales dolorosas.  Sequedad de la piel y aparicin de Banker.  Dolores de [place].  Cansancio.  Irritabilidad.  Problemas de memoria.  Aumento de [place].  Infeccin de la vejiga.  Aparicin de vello en el rostro y [place].  Infertilidad. Los sntomas ms graves pueden incluir:  Prdida de masa sea osteoporosis) que favorece la rotura de huesos(fracturas).  Depresin.  Endurecimiento y Scientist, research (medical) de las arterias (aterosclerosis) lo que puede causar infarto e ictus. DIAGNSTICO   El perodo menstrual no aparece por 12 meses seguidos.  Examen fsico.  Estudios hormonales de Risk manager. TRATAMIENTO  Hay muchas opciones de tratamiento y casi tantas dudas como opciones existen. La decisin de tratar o no los cambios que trae la menopausia es una decisin que realiza el profesional de acuerdo con cada Marketing executive. El Animal nutritionist las opciones de tratamiento con usted. Juntos podrn decidir qu tratamiento es el mejor para usted. Las opciones de tratamiento pueden incluir:   Terapia hormonal (estrgenos y Education officer, museum).  Medicamentos no hormonales.  Tratamiento de los sntomas individuales con medicamentos (por ejemplo antidepresivos para la depresin).  Algunos medicamentos herbales pueden ayudar en sntomas  especficos.  Psicoterapia con un psiclogo o psiquiatra.  Terapia grupal.  Cambios en el estilo de vida, como:  Consumir una dieta saludable.  Actividad fsica regular.  Limitar el consumo de cafena y alcohol.  Control del estrs y Landscape architect.  No realizar tratamiento. INSTRUCCIONES PARA EL CUIDADO EN EL HOGAR   Tome todos los medicamentos como el mdico le indique.  Descanse y duerma lo suficiente.  Haga ejercicios regularmente.  Consuma una dieta que contenga calcio (bueno para los [place]) y productos derivados de la soja (actan como estrgenos).  Evite las bebidas alcohlicas.  No fume.  Si tiene sofocones, vstase en capas.  Tome suplementos de calcio y vitamina D para fortalecer los [place].  Puede utilizar lubricantes y humectantes de venta libre para la sequedad vaginal.  En algunos casos la terapia de grupo podr ayudarla.  La acupuntura puede ser de ayuda en ciertos casos. SOLICITE ATENCIN MDICA SI:   No est segura de estar en la menopausia.  Tiene sntomas de [place] y [place] asesoramiento y TEFL teacher.  Tiene 47 aos y an tiene Environmental manager.  Tiene dolor durante las [employer].  La menopausia se ha completado (no ha tenido el perodo menstrual durante 12 meses) y tiene un sangrado vaginal.  Necesita ser derivada a un especialista (gineclogo, psiquiatra, o psiclogo) para Pensions consultant. SOLICITE ATENCIN MDICA DE INMEDIATO SI:   Siente una depresin intensa e incontrolable.  Tiene una hemorragia vaginal abundante.  Siente y cree que se ha fracturado un hueso.  Siente  dolor al Beatrix Shipper.  Siente dolor en el pecho o en la pierna.  Tiene latidos cardacos rpidos (palpitaciones).  Siente dolor de cabeza intenso.  Tiene problemas de visin.  Siente un bulto en la mama.  Siente un dolor abdominal intenso o indigestin.   Esta informacin no tiene Theme park manager el consejo del mdico. Asegrese  de hacerle al mdico cualquier pregunta que tenga.   Document Released: 04/11/2006 Document Revised: 10/31/2012 Elsevier Interactive Patient Education [employer]

## 2015-11-05 NOTE — Addendum Note (Signed)
Addended by: Berna SpareASTILLO, Eldana Isip A on: 11/05/2015 10:28 AM   Modules accepted: Orders

## 2015-11-06 LAB — URINALYSIS W MICROSCOPIC + REFLEX CULTURE
BILIRUBIN URINE: NEGATIVE
Bacteria, UA: NONE SEEN [HPF]
Casts: NONE SEEN [LPF]
Crystals: NONE SEEN [HPF]
Glucose, UA: NEGATIVE
Hgb urine dipstick: NEGATIVE
KETONES UR: NEGATIVE
NITRITE: NEGATIVE
PH: 6.5 (ref 5.0–8.0)
Protein, ur: NEGATIVE
RBC / HPF: NONE SEEN RBC/HPF (ref <=2)
SPECIFIC GRAVITY, URINE: 1.019 (ref 1.001–1.035)
YEAST: NONE SEEN [HPF]

## 2015-11-07 LAB — URINE CULTURE

## 2015-11-09 LAB — PAP IG W/ RFLX HPV ASCU

## 2016-07-27 ENCOUNTER — Encounter: Payer: Self-pay | Admitting: Gynecology

## 2020-02-15 ENCOUNTER — Other Ambulatory Visit: Payer: Self-pay

## 2020-02-15 ENCOUNTER — Ambulatory Visit
Admission: EM | Admit: 2020-02-15 | Discharge: 2020-02-15 | Disposition: A | Discharge status: Home / Self Care | Payer: No Typology Code available for payment source | Attending: Urgent Care | Admitting: Urgent Care

## 2020-02-15 DIAGNOSIS — B029 Zoster without complications: Secondary | ICD-10-CM

## 2020-02-15 DIAGNOSIS — L299 Pruritus, unspecified: Secondary | ICD-10-CM

## 2020-02-15 DIAGNOSIS — R21 Rash and other nonspecific skin eruption: Secondary | ICD-10-CM

## 2020-02-15 MED ORDER — HYDROXYZINE HCL 25 MG PO TABS: 12.5000 mg | ORAL_TABLET | Freq: Three times a day (TID) | ORAL | 0 refills | Status: AC | PRN

## 2020-02-15 MED ORDER — VALACYCLOVIR HCL 1 G PO TABS: 1000.0000 mg | ORAL_TABLET | Freq: Three times a day (TID) | ORAL | 0 refills | Status: AC

## 2020-02-15 NOTE — ED Triage Notes (Signed)
Pt states she has a rash on her right thigh that began on Wednesday and has not spread to any other part of her body. Pt states it burns and itches but there is no drainage. Pt is aox4 and ambulatory.

## 2020-02-15 NOTE — ED Provider Notes (Signed)
  [facility]   MRN: [id number] DOB: 1960/12/29  Subjective:   Marilyn Gross is a 59 y.o. female presenting for 4-day history of acute onset right leg rash that is painful and wraps around her, started having more spots today along the medial aspect of her thigh.  No current facility-administered medications for this encounter.  Current Outpatient Medications:  Marland Kitchen  Multiple Vitamin (MULTIVITAMIN) capsule, Take 1 capsule by mouth daily., Disp: , Rfl:  .  nitrofurantoin, macrocrystal-monohydrate, (MACROBID) 100 MG capsule, Take 1 capsule (100 mg total) by mouth 2 (two) times daily. (Patient not taking: Reported on 11/05/2015), Disp: 14 capsule, Rfl: 0   No Known Allergies  History reviewed. No pertinent past medical history.   Past Surgical History:  Procedure Laterality Date  . KNEE SURGERY Left     Family History  Problem Relation Age of Onset  . Hypertension Father     Social History   Tobacco Use  . Smoking status: Never Smoker  . Smokeless tobacco: Never Used  Vaping Use  . Vaping Use: Never used  Substance Use Topics  . Alcohol use: No  . Drug use: No    ROS   Objective:   Vitals: BP 116/65 (BP Location: Left Arm)   Pulse 85   Temp 99.6 F (37.6 C) (Oral)   Resp 20   SpO2 96%   Physical Exam Constitutional:      General: She is not in acute distress.    Appearance: Normal appearance. She is well-developed. She is not ill-appearing.  HENT:     Head: Normocephalic and atraumatic.     Nose: Nose normal.     Mouth/Throat:     Mouth: Mucous membranes are moist.     Pharynx: Oropharynx is clear.  Eyes:     General: No scleral icterus.    Extraocular Movements: Extraocular movements intact.     Pupils: Pupils are equal, round, and reactive to light.  Cardiovascular:     Rate and Rhythm: Normal rate.  Pulmonary:     Effort: Pulmonary effort is normal.  Skin:    General: Skin is warm and dry.     Findings: Rash (Resolving  vesicular lesions on an emphysematous face along with dermatomal pattern over right thigh only) present.  Neurological:     General: No focal deficit present.     Mental Status: She is alert and oriented to person, place, and time.  Psychiatric:        Mood and Affect: Mood normal.        Behavior: Behavior normal.       Assessment and Plan :   PDMP not reviewed this encounter.  1. Herpes zoster without complication   2. Rash and nonspecific skin eruption   3. Itching     Recommended Valtrex for her shingles rash.  Use hydroxyzine for itching. Counseled patient on potential for adverse effects with medications prescribed/recommended today, ER and return-to-clinic precautions discussed, patient verbalized understanding.    Wallis Bamberg, [place] 02/15/20 1629

## 2020-05-10 ENCOUNTER — Encounter: Payer: Self-pay | Admitting: *Deleted

## 2020-05-10 ENCOUNTER — Ambulatory Visit (INDEPENDENT_AMBULATORY_CARE_PROVIDER_SITE_OTHER): Payer: Self-pay

## 2020-05-10 ENCOUNTER — Other Ambulatory Visit: Payer: Self-pay

## 2020-05-10 ENCOUNTER — Ambulatory Visit
Admission: EM | Admit: 2020-05-10 | Discharge: 2020-05-10 | Disposition: A | Discharge status: Home / Self Care | Payer: Self-pay | Attending: Emergency Medicine | Admitting: Emergency Medicine

## 2020-05-10 DIAGNOSIS — M25562 Pain in left knee: Secondary | ICD-10-CM

## 2020-05-10 DIAGNOSIS — S61411A Laceration without foreign body of right hand, initial encounter: Secondary | ICD-10-CM

## 2020-05-10 DIAGNOSIS — W19XXXA Unspecified fall, initial encounter: Secondary | ICD-10-CM

## 2020-05-10 MED ORDER — IBUPROFEN 600 MG PO TABS: 600.0000 mg | ORAL_TABLET | Freq: Four times a day (QID) | ORAL | 0 refills | Status: AC | PRN

## 2020-05-10 MED ORDER — TETANUS-DIPHTHERIA TOXOIDS TD 5-2 LFU IM INJ
0.5000 mL | INJECTION | Freq: Once | INTRAMUSCULAR | Status: AC
  Administered 2020-05-10: 0.5 mL via INTRAMUSCULAR

## 2020-05-10 MED ORDER — TETANUS-DIPHTH-ACELL PERTUSSIS 5-2.5-18.5 LF-MCG/0.5 IM SUSY: 0.5000 mL | PREFILLED_SYRINGE | Freq: Once | INTRAMUSCULAR | Status: DC

## 2020-05-10 NOTE — ED Triage Notes (Signed)
Reports slipping while carrying glasses at work yesterday, causing left posterior knee to feel "tense", and sustaining laceration to right anterior hand without active bleeding.  Left knee area non-tender to palpation.

## 2020-05-10 NOTE — ED Notes (Signed)
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

## 2020-05-10 NOTE — ED Provider Notes (Signed)
HPI  SUBJECTIVE:  Marilyn Gross is a right-handed 60 y.o. female who presents with a slip and fall yesterday while at work, sustaining a laceration to the palm of her right hand on some clean glassware.  This occurred about 14 hours prior to evaluation.  She reports mild pain.  No grip or finger weakness, limitation of motion of finger, numbness or tingling in her fingers, foreign body sensation.  She tried cleaning it and covered it.  Has been taking Tylenol for pain with improvement in her symptoms.  Symptoms are worse with making a fist, scrubbing and showering.  This is not a workers comp case.  She also reports posterior knee pain described as "tenseness".  She does not remember falling directly on her knee.  No erythema, swelling, anterior knee pain.  She has a past medical history of left patella fracture status post surgery.  No history of osteoporosis, diabetes, hypertension.   All history obtained through language line  History reviewed. No pertinent past medical history.  Past Surgical History:  Procedure Laterality Date  . KNEE SURGERY Left   . PATELLA FRACTURE SURGERY      Family History  Problem Relation Age of Onset  . Hypertension Father     Social History   Tobacco Use  . Smoking status: Never Smoker  . Smokeless tobacco: Never Used  Vaping Use  . Vaping Use: Never used  Substance Use Topics  . Alcohol use: No  . Drug use: No    No current facility-administered medications for this encounter.  Current Outpatient Medications:  .  ibuprofen (ADVIL) 600 MG tablet, Take 1 tablet (600 mg total) by mouth every 6 (six) hours as needed., Disp: 30 tablet, Rfl: 0 .  hydrOXYzine (ATARAX/VISTARIL) 25 MG tablet, Take 0.5-1 tablets (12.5-25 mg total) by mouth every 8 (eight) hours as needed for itching., Disp: 30 tablet, Rfl: 0 .  Multiple Vitamin (MULTIVITAMIN) capsule, Take 1 capsule by mouth daily., Disp: , Rfl:  .  valACYclovir (VALTREX) 1000 MG tablet, Take 1  tablet (1,000 mg total) by mouth 3 (three) times daily., Disp: 21 tablet, Rfl: 0  No Known Allergies   ROS  As noted in HPI.   Physical Exam  BP 136/70 (BP Location: Right Arm)   Pulse 61   Temp 98.3 F (36.8 C) (Oral)   Resp 20   SpO2 99%   Constitutional: Well developed, well nourished, no acute distress Eyes:  EOMI, conjunctiva normal bilaterally HENT: Normocephalic, atraumatic,mucus membranes moist Respiratory: Normal inspiratory effort Cardiovascular: Normal rate GI: nondistended skin: 2 cm mildly tender L-shaped clean linear laceration ulnar aspect of the palm of right hand that appears to be healing already.  Explored under magnification. no apparent foreign body seen or felt. Musculoskeletal: Right hand: Two-point discrimination intact in all fingers.  Little finger, ring finger FDP/FDS intact.  Strength intact and equal.      L Knee ROM baseline for Pt ,  Patella tender,  Patellar tendon tender, Medial joint NT , Lateral joint NT , Popliteal region NT, Varus MCL stress testing stable, Valgus LCL stress testing stable, McMurray's testing normal , Lachman's negative. Distal NVI with intact baseline sensation / motor / pulse distal to knee.  No effusion. No erythema. No increased temperature.  Neurologic: Alert & oriented x 3, no focal neuro deficits Psychiatric: Speech and behavior appropriate   ED Course   Medications  tetanus & diphtheria toxoids (adult) (TENIVAC) injection 0.5 mL (0.5 mLs Intramuscular Given 05/10/20 1011)  Orders Placed This Encounter  Procedures  . DG Knee AP/LAT W/Sunrise Left    Standing Status:   Standing    Number of Occurrences:   1    Order Specific Question:   Reason for Exam (SYMPTOM  OR DIAGNOSIS REQUIRED)    Answer:   fall s/p patella fx repair, patellar tendon tenderness r/o acute fx  . Wound care    Standing Status:   Standing    Number of Occurrences:   1    No results found for this or any previous visit (from the past  24 hour(s)). No results found.  ED Clinical Impression  1. Laceration of right hand without foreign body, initial encounter   2. Acute pain of left knee   3. Fall, initial encounter      ED Assessment/Plan  1.  Hand laceration:  using the language line, we discussed stitches versus Steri-Strips, I think that we can achieve excellent results with Steri-Strips. Tetanus updated.  Home with Tylenol/ibuprofen.  Do not think that we need prophylactic antibiotics as this was sustained on clean glassware.  Procedure note: Had patient wash wound out thoroughly.  Cleaned area with alcohol.  Applied benzoin and 4 Steri-Strips with close approximation of wound edges.  Tissue adhesive placed on top of this to reinforce Steri-Strips.  Dressing placed.  Patient instructed to get splint from [employer].  Patient tolerated procedure well.  2.  Knee pain: Imaged because of history of surgery and patellar tendon tenderness.   Reviewed imaging independently.  Normal knee.  See radiology report for full details.  X-ray negative.  Patient with contusion to the left knee.  Ice, Tylenol/ibuprofen.  Using the language line, discussed imaging, MDM, treatment plan, and plan for follow-up with patient. Discussed sn/sx that should prompt return to the ED. patient agrees with plan.   Meds ordered this encounter  Medications  . DISCONTD: Tdap (BOOSTRIX) injection 0.5 mL  . tetanus & diphtheria toxoids (adult) (TENIVAC) injection 0.5 mL  . ibuprofen (ADVIL) 600 MG tablet    Sig: Take 1 tablet (600 mg total) by mouth every 6 (six) hours as needed.    Dispense:  30 tablet    Refill:  0    *This clinic note was created using Scientist, clinical (histocompatibility and immunogenetics). Therefore, there may be occasional mistakes despite careful proofreading.   ?   Domenick Gong, MD 05/11/20 [phone]

## 2020-05-10 NOTE — Discharge Instructions (Addendum)
Get a hand brace to add an extra layer of protection to this.  Do not get this wet for at least 72 hours.  The Steri-Strips will come off on their own.  It takes 8 to 10 days for this to completely heal.  Leave them on as long as you possibly can.  Return here for any signs of infection.

## 2021-05-27 IMAGING — DX DG KNEE AP/LAT W/ SUNRISE*L*
3 series · 3 of 3 positions shown · non-contrast
Comparison: Knee radiograph September 24, 2006 and September 27, 2006

CLINICAL DATA: Fall, history of prior patellar tendon fracture and
repair.

EXAM:
LEFT KNEE 3 VIEWS

[knee standing ap]
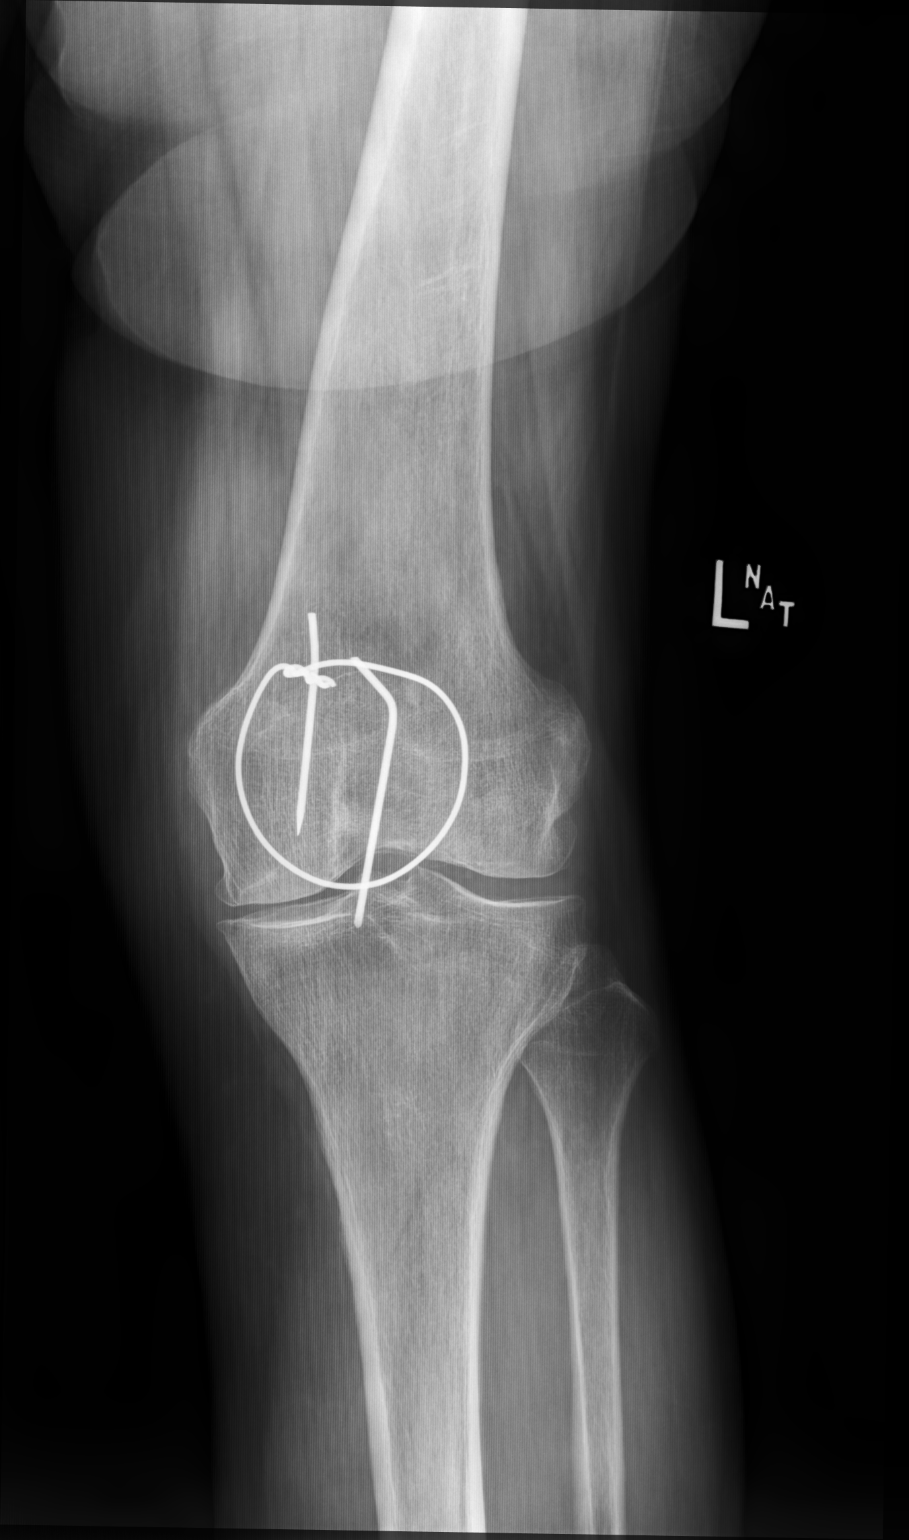

[knee standing lat]
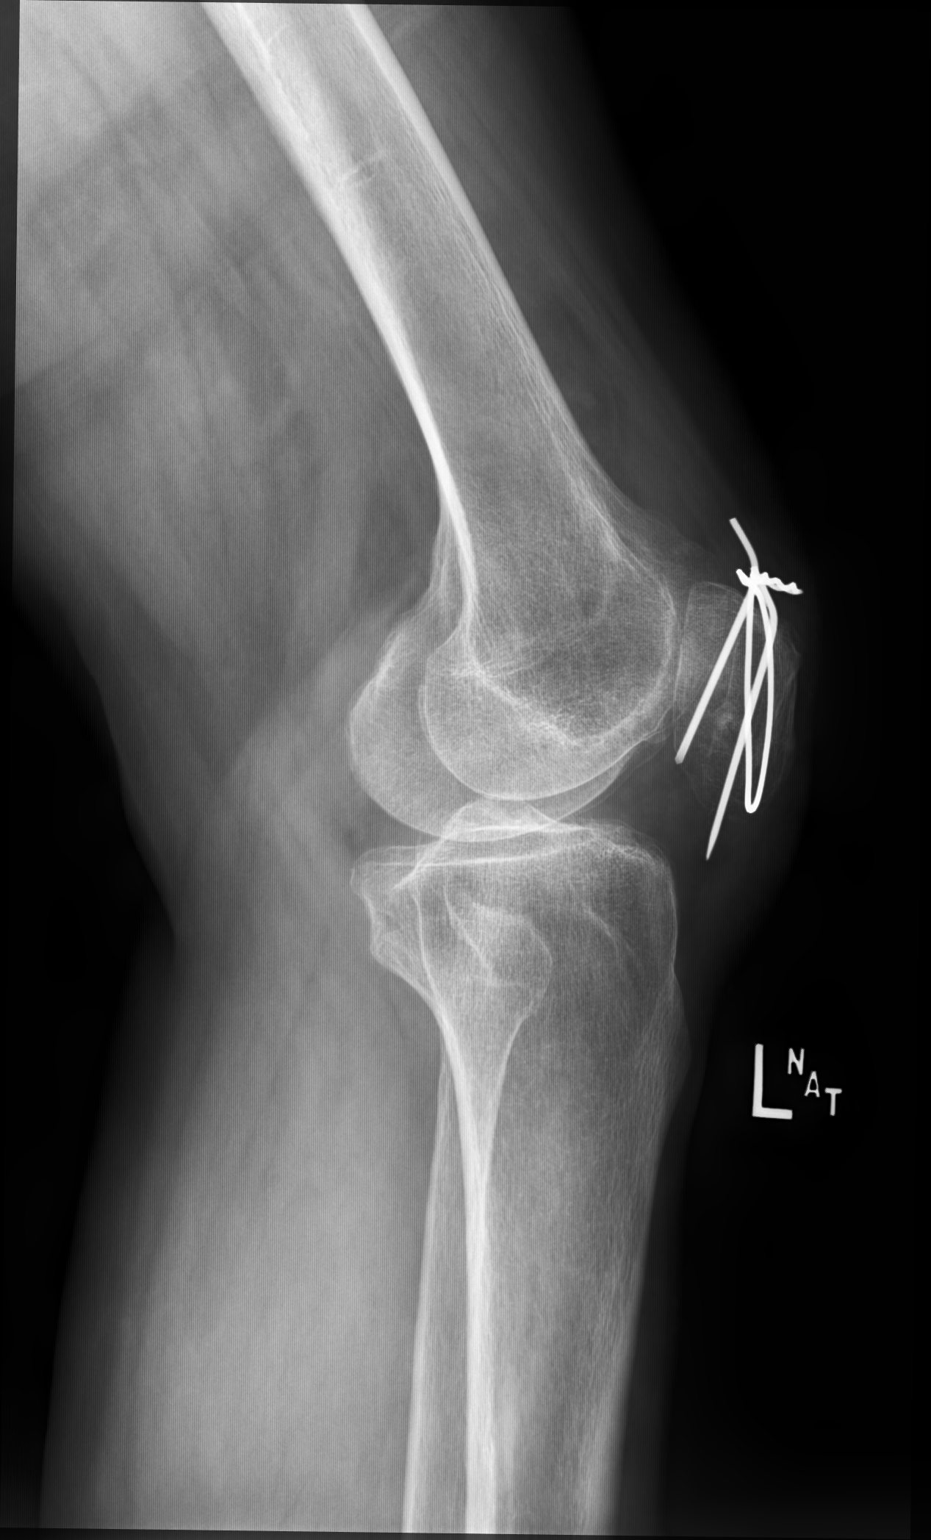

[patella tangential]
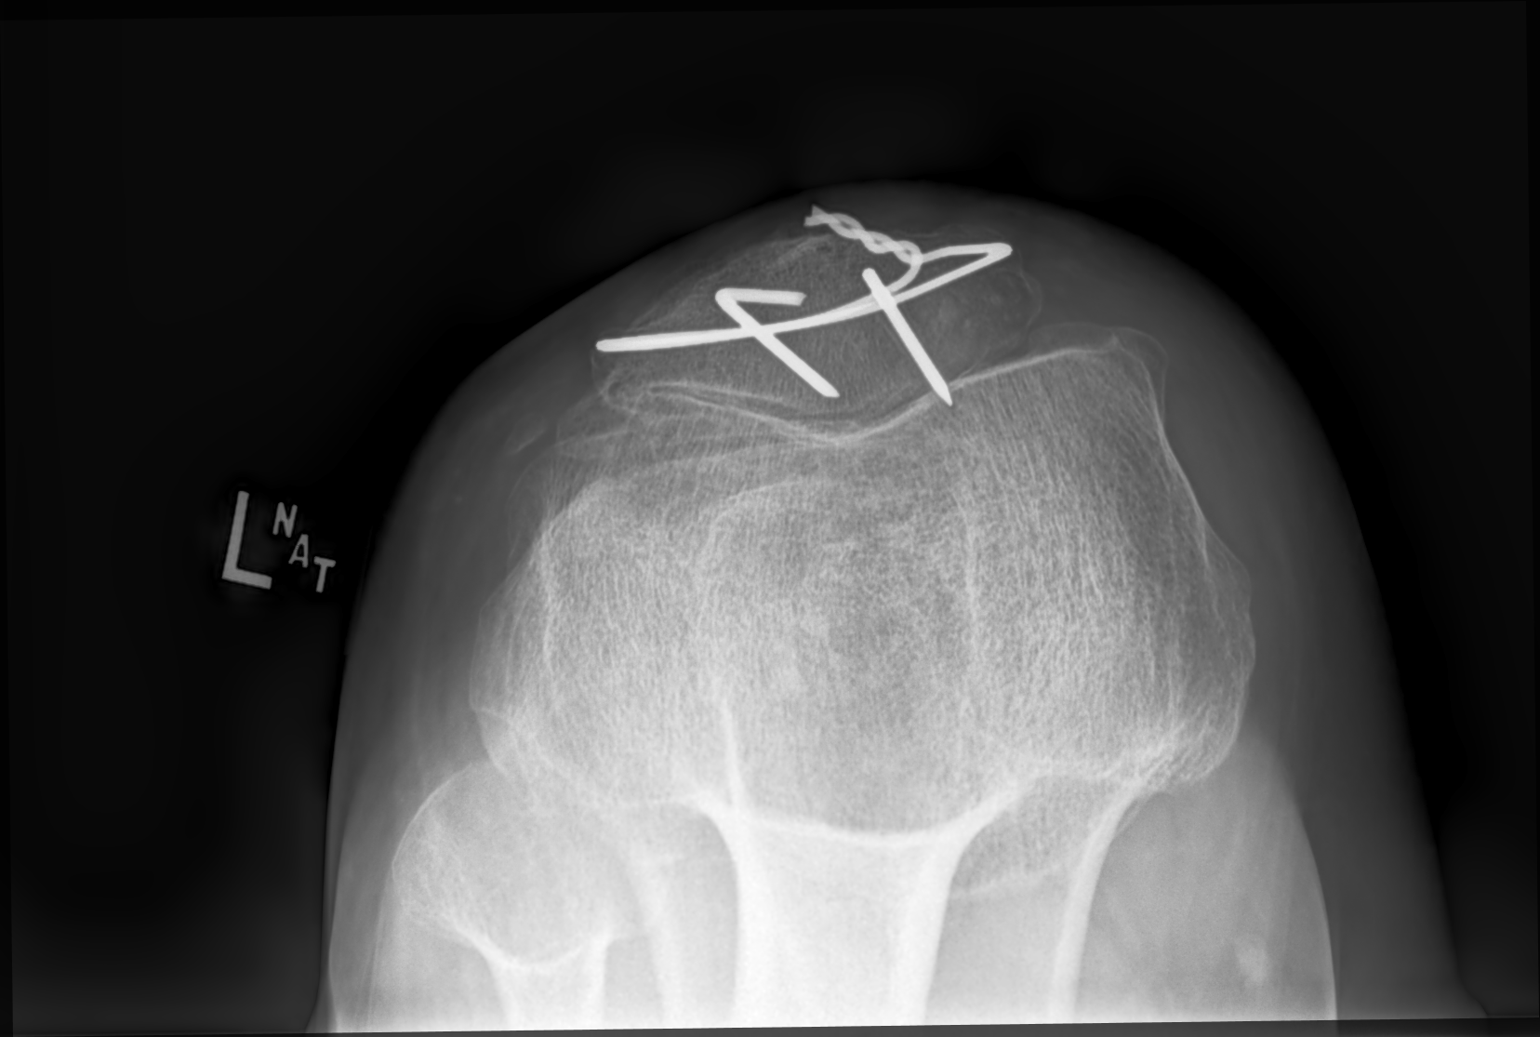

[3 of 3 positions shown; findings below may reference images not displayed]

FINDINGS: Changes of prior open reduction and internal fixation by pin and
wire of a patellar fracture which appears unremarkable. No evidence
of hardware complication. No evidence of acute fracture,
dislocation, or joint effusion. Soft tissues are unremarkable.
IMPRESSION: No acute osseous abnormality.
